# Patient Record
Sex: Female | Born: 1980
Health system: Southern US, Community
[De-identification: ages and names within clinical notes are randomized; demographics above are authoritative.]

## PROBLEM LIST (undated history)

## (undated) ENCOUNTER — Inpatient Hospital Stay (HOSPITAL_COMMUNITY): Payer: Self-pay

## (undated) DIAGNOSIS — R519 Headache, unspecified: Secondary | ICD-10-CM

## (undated) DIAGNOSIS — O139 Gestational [pregnancy-induced] hypertension without significant proteinuria, unspecified trimester: Secondary | ICD-10-CM

## (undated) DIAGNOSIS — I10 Essential (primary) hypertension: Secondary | ICD-10-CM

## (undated) DIAGNOSIS — R51 Headache: Secondary | ICD-10-CM

## (undated) DIAGNOSIS — D649 Anemia, unspecified: Secondary | ICD-10-CM

---

## 2005-10-31 ENCOUNTER — Ambulatory Visit: Payer: Self-pay | Admitting: Family Medicine

## 2005-11-03 ENCOUNTER — Ambulatory Visit: Payer: Self-pay | Admitting: Family Medicine

## 2005-11-05 ENCOUNTER — Ambulatory Visit (HOSPITAL_COMMUNITY): Admission: RE | Admit: 2005-11-05 | Discharge: 2005-11-05 | Payer: Self-pay | Admitting: Family Medicine

## 2005-11-10 ENCOUNTER — Encounter (INDEPENDENT_AMBULATORY_CARE_PROVIDER_SITE_OTHER): Payer: Self-pay | Admitting: Specialist

## 2005-11-10 ENCOUNTER — Ambulatory Visit: Payer: Self-pay | Admitting: Family Medicine

## 2006-10-07 ENCOUNTER — Ambulatory Visit: Payer: Self-pay | Admitting: Cardiology

## 2006-10-21 ENCOUNTER — Encounter: Payer: Self-pay | Admitting: Obstetrics and Gynecology

## 2006-10-21 ENCOUNTER — Inpatient Hospital Stay (HOSPITAL_COMMUNITY): Admission: AD | Admit: 2006-10-21 | Discharge: 2006-10-22 | Payer: Self-pay | Admitting: Family Medicine

## 2006-10-29 ENCOUNTER — Encounter: Payer: Self-pay | Admitting: Cardiology

## 2006-10-29 ENCOUNTER — Ambulatory Visit: Payer: Self-pay

## 2007-01-23 ENCOUNTER — Inpatient Hospital Stay (HOSPITAL_COMMUNITY): Admission: AD | Admit: 2007-01-23 | Discharge: 2007-01-23 | Payer: Self-pay | Admitting: Obstetrics and Gynecology

## 2007-01-24 ENCOUNTER — Inpatient Hospital Stay (HOSPITAL_COMMUNITY): Admission: AD | Admit: 2007-01-24 | Discharge: 2007-01-24 | Payer: Self-pay | Admitting: Obstetrics and Gynecology

## 2007-01-26 ENCOUNTER — Inpatient Hospital Stay (HOSPITAL_COMMUNITY): Admission: AD | Admit: 2007-01-26 | Discharge: 2007-01-30 | Payer: Self-pay | Admitting: Obstetrics and Gynecology

## 2007-01-27 ENCOUNTER — Encounter (INDEPENDENT_AMBULATORY_CARE_PROVIDER_SITE_OTHER): Payer: Self-pay | Admitting: Obstetrics and Gynecology

## 2010-04-10 ENCOUNTER — Inpatient Hospital Stay (HOSPITAL_COMMUNITY): Admission: AD | Admit: 2010-04-10 | Discharge: 2010-04-13 | Payer: Self-pay | Admitting: Obstetrics and Gynecology

## 2010-07-30 LAB — CBC
HCT: 33.9 % — ABNORMAL LOW (ref 36.0–46.0)
Hemoglobin: 10.9 g/dL — ABNORMAL LOW (ref 12.0–15.0)
Hemoglobin: 9.4 g/dL — ABNORMAL LOW (ref 12.0–15.0)
MCH: 25.9 pg — ABNORMAL LOW (ref 26.0–34.0)
MCH: 26 pg (ref 26.0–34.0)
MCHC: 32.2 g/dL (ref 30.0–36.0)
MCHC: 32.5 g/dL (ref 30.0–36.0)
MCV: 80.1 fL (ref 78.0–100.0)
MCV: 80.2 fL (ref 78.0–100.0)
MCV: 80.8 fL (ref 78.0–100.0)
RBC: 3.62 MIL/uL — ABNORMAL LOW (ref 3.87–5.11)
RBC: 3.9 MIL/uL (ref 3.87–5.11)
RBC: 4.23 MIL/uL (ref 3.87–5.11)
RDW: 17.9 % — ABNORMAL HIGH (ref 11.5–15.5)
WBC: 16.1 10*3/uL — ABNORMAL HIGH (ref 4.0–10.5)
WBC: 7.5 10*3/uL (ref 4.0–10.5)
WBC: 9.5 10*3/uL (ref 4.0–10.5)

## 2010-07-30 LAB — URINALYSIS, ROUTINE W REFLEX MICROSCOPIC
Bilirubin Urine: NEGATIVE
Nitrite: NEGATIVE
Protein, ur: NEGATIVE mg/dL
Urobilinogen, UA: 0.2 mg/dL (ref 0.0–1.0)

## 2010-07-30 LAB — COMPREHENSIVE METABOLIC PANEL
AST: 34 U/L (ref 0–37)
Albumin: 2.5 g/dL — ABNORMAL LOW (ref 3.5–5.2)
Alkaline Phosphatase: 185 U/L — ABNORMAL HIGH (ref 39–117)
Alkaline Phosphatase: 235 U/L — ABNORMAL HIGH (ref 39–117)
CO2: 20 mEq/L (ref 19–32)
Calcium: 9 mg/dL (ref 8.4–10.5)
Chloride: 107 mEq/L (ref 96–112)
Creatinine, Ser: 0.59 mg/dL (ref 0.4–1.2)
GFR calc non Af Amer: >60 mL/min (ref >60)
Glucose, Bld: 69 mg/dL — ABNORMAL LOW (ref 70–99)
Glucose, Bld: 76 mg/dL (ref 70–99)
Sodium: 135 mEq/L (ref 135–145)
Total Bilirubin: 0.1 mg/dL — ABNORMAL LOW (ref 0.3–1.2)
Total Bilirubin: 0.9 mg/dL (ref 0.3–1.2)

## 2010-07-30 LAB — LACTATE DEHYDROGENASE: LDH: 200 U/L (ref 94–250)

## 2010-07-30 LAB — RPR: RPR Ser Ql: NONREACTIVE

## 2010-10-01 NOTE — Assessment & Plan Note (Signed)
[facility]                            CARDIOLOGY OFFICE NOTE   NAME:[name]                        MRN:          [id number]  DATE:10/07/2006                            DOB:          1980-10-16    The patient is a 30 year old female whom I am asked to consult on  concerning dyspnea.   The patient has no prior cardiac history.  She typically does not have  dyspnea on exertion, orthopnea, PND, pedal edema, palpitations,  presyncope, syncope, or exertional chest pain.  She is now [redacted] weeks  pregnant.  Over the past week, she has noticed dyspnea at times.  This  is predominantly with exertion, but it can occur with rest.  It does not  change with lying flat.  She apparently is not having PND, and there is  no pedal edema.  She is not having chest pain.  She feel that her heart  rate increases at times, but there are not sustained palpitations, and  there is no presyncope or syncope.  She occasionally feels dizzy, that  is not related to her dyspnea or palpitations.  Because of the above,  were asked to further evaluate.   Her medications include a prenatal vitamin.   SHE HAS NO KNOWN DRUG ALLERGIES.   SOCIAL HISTORY:  She does not smoke or consume alcohol, and she denies  any drug use.   FAMILY HISTORY:  Negative for coronary artery disease.   PAST MEDICAL HISTORY:  There is diabetes mellitus, hypertension, or  hyperlipidemia.  She has had no previous surgeries.  This is her 1st  pregnancy.   REVIEW OF SYSTEMS:  She denies any headaches, fevers, or chills.  There  is no productive cough or hemoptysis.  There is no dysphagia,  odynophagia, melena, or hematochezia.  There is no dysuria or hematuria.  There is no rash or seizure activity.  There is no orthopnea or PND, but  she is complaining of dyspnea on exertion, as described in the HPI.  There is no pedal edema.  The remaining systems are negative.   PHYSICAL EXAMINATION:  Shows a blood  pressure of 98/62, and her pulse is  118.  She weighs 145 pounds.  She is well developed and pregnant.  She is in no acute distress.  SKIN:  Warm and dry.  She does not appear to be depressed.  There is no peripheral clubbing.  HEENT:  Normal with normal eyelids.  NECK:  Supple with a normal upstroke bilaterally.  There no bruits  noted.  I could not appreciate jugular venous distention.  There is no  thyromegaly.  BACK:  Normal.  CHEST:  Clear to auscultation with normal expansion.  CARDIOVASCULAR EXAM:  Reveals a tachycardic rate, but a regular rhythm  is noted.  There is no S3 or S4.  There is a 1/6 systolic ejection  murmur at the left sternal border.  Her PMI is not displaced.  ABDOMINAL EXAM:  Significant for an intrauterine pregnancy.  There is no  tenderness to palpation.  I could not appreciate hepatosplenomegaly or  bruit.  She has 2+ femoral pulses bilaterally, and no bruits.  EXTREMITIES:  Showed no edema.  I could palpate no cords.  She has a  negative Homan's bilaterally.  She has 2+ dorsalis pedis pulses  bilaterally.  NEUROLOGIC EXAM:  Grossly intact.  Her electrocardiogram shows a sinus tachycardia at a rate of 136.  The  axis is normal.  There are no significant ST changes.   DIAGNOSES:  1. Dyspnea:  The etiology of this is not clear to me.  It may be      merely plasma volume expansion related to her pregnancy.  We will      schedule her to have an echocardiogram to quantify her left      ventricular function to exclude early onset of cardiomyopathy that      is pregnancy induced.  If this shows normal left ventricular      function, then I would not pursue further workup.  I will check a      BNP.  Of note, I do not think her dyspnea is related to a pulmonary      embolus.  There is no evidence of deep venous thrombosis on      examination, and I think the likelihood is small.  If her      echocardiogram shows normal left ventricular function, and her BNP       is not significantly elevated, then we will see her back on an as-      needed basis.  2. Tachycardia:  I think this is most likely related to her pregnancy      and volume expansion.  This can be followed in the future.  There      is no significant symptoms with this.     Wanda Frieze Jens Som, MD, [facility]  Electronically Signed    BSC/MedQ  DD: 10/07/2006  DT: 10/07/2006  Job #: [id number]

## 2010-10-01 NOTE — Op Note (Signed)
[name], [name]               ACCOUNT NO.:  [id number]   MEDICAL RECORD NO.:  [id number]          PATIENT TYPE:  INP   LOCATION:  9125                          [facility]   PHYSICIAN:  Janine Limbo, M.D.DATE OF BIRTH:  [birth year]   DATE OF PROCEDURE:  01/27/2007  DATE OF DISCHARGE:                               OPERATIVE REPORT   PREOPERATIVE DIAGNOSES:  1. Term intrauterine gestation.  2. Failure to progress in labor.  3. Chorioamnionitis.   POSTOPERATIVE DIAGNOSES:  1. Term intrauterine gestation.  2. Failure to progress in labor.  3. Chorioamnionitis.   PROCEDURE:  Primary low transverse cesarean section.   SURGEON:  Janine Limbo, M.D.   FIRST ASSISTANT:  Cam Hai, C.N.M.   ANESTHETIC:  Epidural.   DISPOSITION:  [name] is a 30 year old female, gravida 1, para 0, who  presents at 37 weeks' gestation in early labor.  She had a positive beta  strep culture during the third trimester and therefore penicillin was  started.  The patient was 5 cm dilated, 100% effaced, and -2 station  upon presentation on January 26, 2007.  The patient's membranes were  ruptured and she was started on Pitocin.  The patient received an  epidural for pain management.  The patient did not progress during the  course of her labor.  An intrauterine pressure catheter was placed and  Pitocin was started.  The patient had adequate Monte[place] units for  several hours.  A cesarean section was recommended but declined by the  patient.  She developed a temperature to 100.5.  Again a cesarean  section was recommended but declined.  The the patient continued to have  adequate Montevideo units and at approximately 3 a.m. the patient  decided to proceed with cesarean section.  The risk and benefits of  cesarean section had previously been reviewed, but they included, and  not limited to, anesthetic complications, bleeding, infection, and  possible damage to surrounding  organs.   FINDINGS:  A 6 pound 14 ounce female infant Derinda Late) was delivered from  an occiput posterior presentation.  The Apgars were 8 at one minute and  9 at five minutes.  The arterial cord blood pH was 7.49.  The uterus,  fallopian tubes, and the ovaries were normal for the gravid state.   PROCEDURE:  The patient taken to the operating room, where it was  determined that the epidural she had received for labor would be  adequate for cesarean delivery.  A Foley catheter had previously been  placed.  The patient's abdomen was prepped with multiple layers of  Betadine and sterilely draped.  The lower abdomen was injected with 10  mL of 0.5% Marcaine with epinephrine.  A low transverse incision was  made in the abdomen and carried sharply through the subcutaneous tissue,  the fascia, and the anterior peritoneum.  The bladder flap was  developed.  An incision was made in the lower uterine segment and  extended in a low transverse fashion.  The fetal head was delivered  without difficulty.  The mouth and nose were suctioned.  A nuchal cord  was reduced.  The remainder of the infant was then delivered.  The cord  was clamped and cut and the infant was handed to the waiting pediatric  team.  Routine cord blood studies were obtained.  The placenta was  removed.  The uterine cavity was cleaned of amniotic fluid, clotted  blood, and membranes.  The uterine incision was closed using a running  locking suture of 2-0 Vicryl, followed by an imbricating suture of 2-0  Vicryl.  The pelvis was vigorously irrigated.  The patient received a  gram of Ancef intraoperatively.  The anterior peritoneum and the  abdominal musculature were reapproximated in the midline using 2-0  Vicryl.  The fascia and the subcutaneous layer were irrigated.  The  fascia was closed using a running suture of 0 Vicryl followed by three  interrupted sutures of 0 Vicryl.  The subcutaneous layer was closed  using a running suture  of 2-0 Vicryl and figure-of-eight sutures of 3-0  Vicryl.  The skin was reapproximated using a subcuticular suture of 3-0  Monocryl.  The sponge, needle and instrument counts were correct on two  occasions.  Estimated blood loss for the procedure was 800 mL.  The  patient tolerated her procedure well.  She was taken to the recovery  room in stable condition.  The infant was taken to the full-term nursery  in stable condition.  The placenta was sent to pathology.      Janine Limbo, M.D.  Electronically Signed     AVS/MEDQ  D:  01/27/2007  T:  01/27/2007  Job:  [id number]

## 2010-10-01 NOTE — Discharge Summary (Signed)
[name], [name]               ACCOUNT NO.:  [id number]   MEDICAL RECORD NO.:  [id number]          PATIENT TYPE:  INP   LOCATION:  9125                          [facility]   PHYSICIAN:  Naima A. Dillard, M.D. DATE OF BIRTH:  1981/01/08   DATE OF ADMISSION:  01/26/2007  DATE OF DISCHARGE:  01/30/2007                               DISCHARGE SUMMARY   ADMISSION DIAGNOSES:  1. Intrauterine pregnancy at 40 weeks.  2. Active labor.  3. Positive group B strep.   DISCHARGE DIAGNOSES:  1. Intrauterine pregnancy at 40 weeks.  2. Failure to progress in labor.  3. Chorioamnionitis.   PROCEDURE:  Primary low transverse cesarean section.   HOSPITAL COURSE:  Ms. [name] is a 30 year old primigravida who was  admitted at 12 and 4/7 weeks with uterine contractions and active labor.  Her pregnancy has been followed by the [facility] certified  nurse midwife service and has been remarkable for 1) questionable last  menstrual, 2) positive group B strep.  Upon admission her cervix was 5  cm, 100% effaced, vertex -2 with intact membranes.  She was group B  strep positive and, therefore, was started on IV penicillin.  Her vital  signs were stable.  Fetal heart rate was reactive and reassuring.  The  patient progressed to 7 cm by the afternoon.  At that time, her  membranes ruptured during exam for light meconium-stained fluid.  At the  next exam her cervix was actually 6 cm, 90% vertex at 0 station and that  was unchanged by 2 hours later.  Pitocin was started.  The patient did  receive some IV Stadol.  By that evening she was requesting an epidural.  Her fetal heart rate was reassuring.  __________  use showed adequate  labor with Pitocin at 6 milliunits per minute. Cervix was remaining at 5-  6 cm.  By 10:30 p.m. that evening her cervix was still unchanged  Mon[place]video units were greater than 200, temperature was 100.5, and her  ACT was negative.  At that point, Dr. Stefano Gaul was  notified and was  requested to speak with the patient to recommend a cesarean section.  Risks and benefits were discussed, risks of infection were outlined.  At  that point, the patient declined cesarean section and wanted to continue  to attend to vaginal delivery.  By 2:20 a.m. the patient was requesting  another cervical exam, which was still unchanged at 5 cm.  Fetal heart  rate was in the 150s but variability was decreased.  There were  occasional mild variables, contractions remained adequate, temperature  was 100.  Cesarean section continued to be recommended and at that point  the patient consented.  She was prepared to go to the operating room at  that time.  Cesarean section was performed by Dr. Stefano Gaul with Philipp Deputy  certified nurse midwife as first assistant.  Infant was a viable female  by the name of [name], she weighed 6 pounds and 14 ounces, Apgars were 8  and 9.  Cord pH was 7.49.  The infant was taken to  the full-term nursery  in good condition.  The patient tolerated the remainder of the cesarean  section well, and she was taken to the recovery room.  By post-op day  #1, the patient's vital signs were stable.  Her hemoglobin was 8.5 and  had been 11.1 preoperatively.  She was breast and bottle feeding.  By  post-op day #2, she continued to do well.  By day three she was having  some mild abdominal distention and some discomfort.  She had not had a  bowel movement since delivery.  The plan made was to administer Dulcolax  suppository and Fleet's enema as needed in attempts to assist the  patient in having a bowel movement prior to discharge home. Discharge  instructions per [facility] handout.   DISCHARGE MEDICATIONS:  1. Motrin 600 mg 1 p.o. q.6 h p.r.n. pain.  2. Tylox 1-2 p.o. q.3-4 h p.r.n. pain.  3. Prenatal vitamin 1 p.o. daily.   Discharge follow-up will occur at [facility] in 6 weeks or as  needed.      [name], C.N.M.       Naima A. Normand Sloop, M.D.  Electronically Signed    KS/MEDQ  D:  01/30/2007  T:  01/31/2007  Job:  [id number]

## 2010-10-01 NOTE — H&P (Signed)
[name], [name]               ACCOUNT NO.:  [id number]   MEDICAL RECORD NO.:  [id number]          PATIENT TYPE:  INP   LOCATION:  9162                          [facility]   PHYSICIAN:  Janine Limbo, M.D.DATE OF BIRTH:  [birth year]   DATE OF ADMISSION:  01/26/2007  DATE OF DISCHARGE:                              HISTORY & PHYSICAL   [name] is a 30 year old gravida 1, para 0, at 40-4/7 weeks.  Presented with uterine contractions for several days, now increasing.  She was seen in maternity admission unit on September 6 and September 7.  Last exam her cervix was 1 cm, 60%.  She reports positive fetal  movement.  She denies any leaking or bleeding.  Pregnancy remarkable  for:  1. Questionable LMP.  2. Positive group B strep.   PRENATAL LABS:  Blood type is 0 positive.  RH antibody negative.  VDRL  nonreactive.  Viral titer positive.  Hepatitis B surface antigen  negative.  Group B strep culture was positive from a urine culture.  Quadruple screen was done, and the OB labs.  GC and Chlamydia cultures  were negative.  Pap smear was normal.  Cystic fibrosis testing was  declined.  Hemoglobin upon entering the practice was 11.2. It was within  normal limits.  The patient has positive urine culture at 26 weeks.   OBSTETRICAL HISTORY:  Present pregnancy.  The patient entered care at  approximately 15 weeks.  Cervix was normal.  Anatomy was normal.  Quadruple screen was done.  She had another ultrasound at 18 weeks for  anatomy with normal findings.  She had onset of questionable  pyelonephritis symptoms at 26 weeks.  She was admitted to the hospital.  She was admitted between June 4 and June 5 with resolution of pain.  She  was treated with Macrobid.  Glucola was normal.  She had positive group  B strep.  Positive urine and vaginal beta strep cultures.  She had an  ultrasound at 37 weeks with normal findings.  The rest of her pregnancy  has been uncomplicated.  She has been  seen three times over the last  several days in [facility].   The patient is a prima gravida.   MEDICAL HISTORY:  She has occasional yeast infection.  Usual childhood  illnesses.   FAMILY HISTORY:  Noncontributory with no history of hypertension,  diabetes or thyroid disease.   GENETIC HISTORY:  Remarkable for maternal grandmother being a twin and  first cousin having twins.   SOCIAL HISTORY:  The patient is engaged.  The father of the baby is  involved and supportive.  His name is [name].  The patient is  African.  She is from [place].  She is of the [place] faith.  She has  a high school education.  She is Actor at [employer]. Her partner is high school educated.  He is a Clinical cytogeneticist.  She has been followed by the certified nurse midwife service  and a [place].  She denies any alcohol, drug or tobacco use  during this pregnancy.   PHYSICAL EXAMINATION:  VITAL SIGNS:  Stable.  The patient is afebrile.  HEENT: Within normal limits.  LUNGS:  Breath sounds are clear.  HEART:  Regular rate and rhythm without murmur.  BREASTS:  Soft and nontender.  ABDOMEN:  Fundal height is approximately 39 cm.  Estimated fetal weight  is 7 to 7-1/2 pounds.  Uterine contractions are every 2 to 3 minutes.  Moderate quality.  CERVICAL:  5 cm, 100%, vertex at minus 2 station with a bulging bag of  water.  Fetal heart rate is reactive. No decelerations.  EXTREMITIES:  Deep tendon reflexes are 2+ without clonus.  There is a  trace edema noted.   IMPRESSION:  1. Intrauterine pregnancy at 40 weeks.  2. Active labor.  3. Positive group B strep.   PLAN:  1. Admit to birthing suite, consult with Dr. Stefano Gaul who is the      attending physician.  2. Routine certified nurse midwife orders.  3. Plan group B strep prophylaxis, Penicillin G per standard dosing.  4. The patient prefers IV pain medication.      [name], C.N.M.      Janine Limbo, M.D.  Electronically Signed    VLL/MEDQ  D:  01/26/2007  T:  01/26/2007  Job:  [id number]

## 2010-10-01 NOTE — H&P (Signed)
[name], [name]               ACCOUNT NO.:  [id number]   MEDICAL RECORD NO.:  [id number]          PATIENT TYPE:  INP   LOCATION:  9162                          [facility]   PHYSICIAN:  Vicki L. Emilee Hero, C.N.M.DATE OF BIRTH:  1980-06-27   DATE OF ADMISSION:  01/26/2007  DATE OF DISCHARGE:                              HISTORY & PHYSICAL   No dictation for this job.      [name] Emilee Hero, C.N.M.     VLL/MEDQ  D:  01/26/2007  T:  01/26/2007  Job:  [id number]

## 2010-10-01 NOTE — H&P (Signed)
[name], [name]               ACCOUNT NO.:  [id number]   MEDICAL RECORD NO.:  [id number]          PATIENT TYPE:  MAT   LOCATION:  MATC                          [facility]   PHYSICIAN:  Janine Limbo, M.D.DATE OF BIRTH:  1981/03/19   DATE OF ADMISSION:  10/21/2006  DATE OF DISCHARGE:                              HISTORY & PHYSICAL   The patient is a 30 year old gravida 1, para 0 at 26 weeks and 1 day,  EDD January 26, 2007 as determined by 15-week ultrasound and confirmed  with follow-up.  She presents from the office of [facility] with complaint of  fever and chills since last p.m. with right lower quadrant and lower  abdominal pain. Pain is constant and then becomes sharp and more severe  at times. She reports positive fetal movement.  No vaginal bleeding or  rupture of membranes.  She has no cold or upper respiratory infection  symptoms.  No nausea or vomiting.  She did eat breakfast this a.m.  however, she has no appetite at this time. Her pregnancy has been  followed by the C.N.M. service at [facility] and is remarkable for  questionable LMP.  This patient began prenatal care on July 22, 2005 at  approximately 12 weeks' gestation, [facility] determined by pregnancy  ultrasound and confirmed with follow-up.  Her pregnancy has been  essentially unremarkable to this point.   PRENATAL LAB WORK:  Hemoglobin and hematocrit 11.2 and 33.4, platelets  233,000.  Blood type and Rh O+, antibody screen negative, VDRL  nonreactive, rubella immune, hepatitis B surface antigen negative.  Pap  smear within normal limits.  GC and chlamydia negative.  CF testing  declined.  Quad screen within normal limits.   MEDICAL HISTORY:  Is unremarkable.   FAMILY HISTORY:  Is unremarkable.   GENETIC HISTORY:  There is no genetic history of familial or chromosomal  disorders, any babies that were born with birth defects or any that died  in infancy.  The patient has no known drug allergies.  She denies the  use of tobacco, alcohol or illicit drugs.  Her height is 5 feet.  Her  pre gravid weight is 128.   SOCIAL HISTORY:  The patient is a 30 year old gravida 1, para 0 at 33  weeks who presents with fever and chills and right lower quadrant  abdominal pain.   PHYSICAL EXAM:  VITAL SIGNS:  Temperature 101.9, pulse 121, respirations  18, blood pressure 109/57.  Fetal heart rate is 168 with positive  variability, positive accelerations. She is contracting every 3-5  minutes.  The patient's heart rate is tachycardic.  LUNGS:  Clear and equal bilaterally to auscultation.  ABDOMEN:  Soft and tender, soft to palpation.  It is tender over entire  lower abdomen especially on the right with no guarding or rebound.  She  has negative CVA tenderness bilaterally.  EXTREMITIES:  Show no edema.  Negative Homan's bilaterally.   Cath UA shows specific gravity less than 1.005, pH of 6, ketones of 40  mg per dL, otherwise negative.  CBC shows a WBC of 18.5, hemoglobin  10.2, hematocrit 31.5 and platelets 187,000.  Neutrophils are up at 87.  Absolute granulocytes up at 16.1 and absolute monocytes up at 1.1.   ASSESSMENT:  Intrauterine pregnancy at 26-1/7 weeks, right lower  quadrant pain, maternal febrile morbidity, rule out appendicitis.   PLAN:  Admit, MRI to evaluate appendicitis.  This will be done at [facility] or [facility].  Begin Zosyn 3.375 mg IV q.6 hours.      Rica Koyanagi, C.N.M.      Janine Limbo, M.D.  Electronically Signed    SDM/MEDQ  D:  10/21/2006  T:  10/21/2006  Job:  [id number]

## 2010-10-04 NOTE — Discharge Summary (Signed)
[name], [name]               ACCOUNT NO.:  [id number]   MEDICAL RECORD NO.:  [id number]          PATIENT TYPE:  INP   LOCATION:  9174                          [facility]   PHYSICIAN:  Janine Limbo, M.D.DATE OF BIRTH:  04/16/1981   DATE OF ADMISSION:  10/21/2006  DATE OF DISCHARGE:  10/22/2006                               DISCHARGE SUMMARY   ADMISSION DIAGNOSES:  1. Intrauterine pregnancy at 26-1/7 weeks.  2. Right lower quadrant pain.  3. Maternal febrile morbidity.  4. Rule out appendicitis.   DISCHARGE DIAGNOSES:  1. Intrauterine pregnancy at 26-2/7 weeks.  2. Questionable pyelonephritis.  3. No evidence of appendicitis or other morbidity.   HOSPITAL PROCEDURES:  1. IV antibiotics.  2. MRI.   HOSPITAL COURSE:  The patient was admitted at 26 weeks with fever and  chills with right lower quadrant and lower abdominal pain.  She had a  temperature of 101.9.  fetal heart rate was 160's.  Her lungs were  clear.  Heart was tachycardic.  Abdomen was soft and tender to palpation  over the entire abdomen. No guarding or rebound was noted.  Urinalysis  was negative except for 40 of ketones.  CBC showed white blood cell  count of 18.5, hemoglobin of 10.2 with increased neutrophils at 87.  She  was admitted for evaluation and treatment.  She was begun on Zosyn for  antibiotic coverage and was taken to [facility] for MRI.  The  MRI showed no signs of appendicitis.  Later that evening her temperature  was 99.6.  Her exam was stable. She was discontinued on the Zosyn and  started on Ancef.  The next morning she was feeling fine.  No pain,  nausea or vomiting.  Temperature was 97.8. Abdomen was nontender.  Fetal  fibronectin was negative.  Gonorrhea and Chlamydia were negative.  White  blood cell count was down to 11.2.  The ultrasound showed vertex  presentation, normal fluid, normal growth, normal anatomy, and a long  cervix.  She was having sporadic  contractions that she did not feel.  Renal ultrasound showed normal appearance with bilateral ureteral jets.  No evidence of stones.  There were no further contractions.  She was  deemed to have received full benefit of her hospital stay and was  discharged home on Keflex for 7 days and followed up in the office.   DISCHARGE MEDICATIONS:  Keflex 500 mg p.o. b.i.d. x7 days.   DISCHARGE LABORATORY DATA:  White blood cell count 11.2, hemoglobin 9.7,  platelet count 163,000.  Fetal fibronectin negative.  Gonorrhea and  Chlamydia both negative.  Urine culture and group B Strep pending.   DISCHARGE INSTRUCTIONS:  Include observing for signs and symptoms of  worsening.   FOLLOWUP:  Discharge followup in one week.   CONDITION ON DISCHARGE:  Good.      Marie L. Williams, C.N.M.      Janine Limbo, M.D.  Electronically Signed    MLW/MEDQ  D:  11/29/2006  T:  11/30/2006  Job:  [id number]

## 2011-02-28 LAB — CBC
MCV: 73.4 — ABNORMAL LOW
Platelets: 139 — ABNORMAL LOW
RBC: 4.63
WBC: 10.4
WBC: 11.4 — ABNORMAL HIGH

## 2011-02-28 LAB — RPR: RPR Ser Ql: NONREACTIVE

## 2011-02-28 LAB — COMPREHENSIVE METABOLIC PANEL
Albumin: 2.9 — ABNORMAL LOW
Alkaline Phosphatase: 348 — ABNORMAL HIGH
CO2: 22
Calcium: 8.9
Chloride: 104
Creatinine, Ser: 0.71
GFR calc Af Amer: >60
Glucose, Bld: 74
Sodium: 135

## 2011-02-28 LAB — URIC ACID: Uric Acid, Serum: 6

## 2011-02-28 LAB — RAPID HIV SCREEN (WH-MAU): Rapid HIV Screen: NONREACTIVE

## 2011-03-06 LAB — DIFFERENTIAL
Basophils Relative: 0
Eosinophils Absolute: 0
Eosinophils Relative: 0
Eosinophils Relative: 0
Lymphocytes Relative: 11 — ABNORMAL LOW
Lymphocytes Relative: 7 — ABNORMAL LOW
Lymphs Abs: 1.2
Lymphs Abs: 1.3
Monocytes Absolute: 1.1 — ABNORMAL HIGH
Monocytes Relative: 6
Monocytes Relative: 6

## 2011-03-06 LAB — CBC
HCT: 29.8 — ABNORMAL LOW
Hemoglobin: 10.2 — ABNORMAL LOW
MCV: 77.9 — ABNORMAL LOW
MCV: 77.9 — ABNORMAL LOW
RBC: 3.83 — ABNORMAL LOW
RBC: 4.05
RDW: 15.4 — ABNORMAL HIGH
WBC: 11.2 — ABNORMAL HIGH

## 2011-03-06 LAB — COMPREHENSIVE METABOLIC PANEL
ALT: 10
Alkaline Phosphatase: 63
BUN: 3 — ABNORMAL LOW
CO2: 20
Chloride: 103
Creatinine, Ser: 0.59
Glucose, Bld: 88
Sodium: 133 — ABNORMAL LOW

## 2011-03-06 LAB — URINALYSIS, ROUTINE W REFLEX MICROSCOPIC
Bilirubin Urine: NEGATIVE
Ketones, ur: 40 — AB
Nitrite: NEGATIVE
Protein, ur: NEGATIVE
Urobilinogen, UA: 0.2

## 2012-04-28 ENCOUNTER — Ambulatory Visit (INDEPENDENT_AMBULATORY_CARE_PROVIDER_SITE_OTHER): Payer: 59 | Admitting: Obstetrics and Gynecology

## 2012-04-28 ENCOUNTER — Encounter: Payer: Self-pay | Admitting: Obstetrics and Gynecology

## 2012-04-28 VITALS — BP 120/70 | HR 68 | Resp 16 | Ht 60.0 in | Wt 136.0 lb

## 2012-04-28 DIAGNOSIS — Z124 Encounter for screening for malignant neoplasm of cervix: Secondary | ICD-10-CM

## 2012-04-28 DIAGNOSIS — Z01419 Encounter for gynecological examination (general) (routine) without abnormal findings: Secondary | ICD-10-CM

## 2012-04-28 NOTE — Progress Notes (Addendum)
Contraception None Last pap 05/23/2010 Last Mammo None Last Colonoscopy None Last Dexa Scan None Primary MD None Abuse at Home None  No complaints  Filed Vitals:   04/28/12 1102  BP: 120/70  Pulse: 68  Resp: 16   ROS: noncontributory  Physical Examination: General appearance - alert, well appearing, and in no distress Neck - supple, no significant adenopathy Chest - clear to auscultation, no wheezes, rales or rhonchi, symmetric air entry Heart - normal rate and regular rhythm Abdomen - soft, nontender, nondistended, no masses or organomegaly Breasts - breasts appear normal, no suspicious masses, no skin or nipple changes or axillary nodes Pelvic - normal external genitalia, vulva, vagina, cervix, uterus and adnexa Back exam - no CVAT Extremities - no edema, redness or tenderness in the calves or thighs  A/P Pap today RTO 45yr for AEX Refer to Dr. Allyne Gee - pt needs a PCP

## 2012-04-29 LAB — PAP IG W/ RFLX HPV ASCU

## 2013-01-22 LAB — OB RESULTS CONSOLE GBS: STREP GROUP B AG: POSITIVE

## 2013-01-22 LAB — OB RESULTS CONSOLE HIV ANTIBODY (ROUTINE TESTING): HIV: NONREACTIVE

## 2013-02-01 LAB — OB RESULTS CONSOLE GC/CHLAMYDIA
CHLAMYDIA, DNA PROBE: NEGATIVE
Gonorrhea: NEGATIVE

## 2013-02-01 LAB — OB RESULTS CONSOLE HEPATITIS B SURFACE ANTIGEN: Hepatitis B Surface Ag: NEGATIVE

## 2013-02-21 LAB — OB RESULTS CONSOLE ABO/RH: RH Type: POSITIVE

## 2013-02-21 LAB — OB RESULTS CONSOLE RUBELLA ANTIBODY, IGM: Rubella: IMMUNE

## 2013-03-02 ENCOUNTER — Encounter (HOSPITAL_COMMUNITY): Payer: Self-pay | Admitting: Family Medicine

## 2013-03-02 ENCOUNTER — Other Ambulatory Visit: Payer: Self-pay | Admitting: Family Medicine

## 2013-03-02 DIAGNOSIS — Z3689 Encounter for other specified antenatal screening: Secondary | ICD-10-CM

## 2013-03-02 DIAGNOSIS — O283 Abnormal ultrasonic finding on antenatal screening of mother: Secondary | ICD-10-CM

## 2013-03-11 ENCOUNTER — Ambulatory Visit (HOSPITAL_COMMUNITY): Admission: RE | Admit: 2013-03-11 | Payer: 59 | Source: Ambulatory Visit

## 2013-03-11 ENCOUNTER — Encounter (HOSPITAL_COMMUNITY): Payer: Self-pay

## 2013-03-11 ENCOUNTER — Ambulatory Visit (HOSPITAL_COMMUNITY)
Admission: RE | Admit: 2013-03-11 | Discharge: 2013-03-11 | Disposition: A | Discharge status: Home / Self Care | Payer: 59 | Source: Ambulatory Visit | Attending: Family Medicine | Admitting: Family Medicine

## 2013-03-11 ENCOUNTER — Inpatient Hospital Stay (HOSPITAL_COMMUNITY): Admission: AD | Admit: 2013-03-11 | Payer: Self-pay | Source: Ambulatory Visit | Admitting: Obstetrics and Gynecology

## 2013-03-11 DIAGNOSIS — Z1389 Encounter for screening for other disorder: Secondary | ICD-10-CM | POA: Diagnosis not present

## 2013-03-11 DIAGNOSIS — Z363 Encounter for antenatal screening for malformations: Secondary | ICD-10-CM | POA: Insufficient documentation

## 2013-03-11 DIAGNOSIS — O34219 Maternal care for unspecified type scar from previous cesarean delivery: Secondary | ICD-10-CM | POA: Insufficient documentation

## 2013-03-11 DIAGNOSIS — O358XX0 Maternal care for other (suspected) fetal abnormality and damage, not applicable or unspecified: Secondary | ICD-10-CM | POA: Diagnosis present

## 2013-03-11 DIAGNOSIS — O283 Abnormal ultrasonic finding on antenatal screening of mother: Secondary | ICD-10-CM

## 2013-03-11 DIAGNOSIS — Z3689 Encounter for other specified antenatal screening: Secondary | ICD-10-CM

## 2013-03-11 NOTE — Progress Notes (Signed)
[name]  was seen today for an ultrasound appointment.  See full report in [facility].  Comments: [name] was seen today due to a suspicion of a small right cardiac ventricle that was noted on recent ultrasound.  On exam, there appears to be a prominent moderator band that gives the appearance of a smaller right ventricle compared to the left ventricle. The fetal heart anatomy otherwise appears normal.   Impression: Single IUP at 20 0/7 weeks Prominent moderator band in the right ventricle - heart anatomy otherwise appears normal Fetal anatomic survey is otherwise within normal limits. Ultrasound measurements consistent with dates Right lateral placenta without previa Normal amniotic fluid volume  Recommendations: Will schedule fetal echo - although feel that the likelihood of a fetal cardiac abnormality is unlikely Follow-up ultrasounds as clinically indicated.   Alpha Gula, MD

## 2013-07-08 ENCOUNTER — Encounter (HOSPITAL_COMMUNITY): Payer: Self-pay | Admitting: Pharmacist

## 2013-07-08 ENCOUNTER — Other Ambulatory Visit: Payer: Self-pay | Admitting: Obstetrics and Gynecology

## 2013-07-18 ENCOUNTER — Inpatient Hospital Stay (HOSPITAL_COMMUNITY): Admission: RE | Admit: 2013-07-18 | Payer: 59 | Source: Ambulatory Visit

## 2013-07-20 ENCOUNTER — Other Ambulatory Visit: Payer: Self-pay | Admitting: Obstetrics and Gynecology

## 2013-07-22 ENCOUNTER — Encounter (HOSPITAL_COMMUNITY): Payer: Self-pay | Admitting: Registered Nurse

## 2013-07-22 ENCOUNTER — Encounter (HOSPITAL_COMMUNITY)
Admission: RE | Disposition: A | Discharge status: Home / Self Care | Payer: Self-pay | Source: Ambulatory Visit | Attending: Obstetrics and Gynecology

## 2013-07-22 ENCOUNTER — Inpatient Hospital Stay (HOSPITAL_COMMUNITY): Payer: 59 | Admitting: Registered Nurse

## 2013-07-22 ENCOUNTER — Encounter (HOSPITAL_COMMUNITY): Payer: 59 | Admitting: Registered Nurse

## 2013-07-22 ENCOUNTER — Inpatient Hospital Stay (HOSPITAL_COMMUNITY)
Admission: RE | Admit: 2013-07-22 | Discharge: 2013-07-25 | DRG: 766 | Disposition: A | Discharge status: Home / Self Care | Payer: 59 | Source: Ambulatory Visit | Attending: Obstetrics and Gynecology | Admitting: Obstetrics and Gynecology

## 2013-07-22 DIAGNOSIS — O34219 Maternal care for unspecified type scar from previous cesarean delivery: Secondary | ICD-10-CM | POA: Diagnosis present

## 2013-07-22 DIAGNOSIS — O321XX0 Maternal care for breech presentation, not applicable or unspecified: Principal | ICD-10-CM | POA: Diagnosis present

## 2013-07-22 DIAGNOSIS — Z880 Allergy status to penicillin: Secondary | ICD-10-CM

## 2013-07-22 DIAGNOSIS — O9989 Other specified diseases and conditions complicating pregnancy, childbirth and the puerperium: Secondary | ICD-10-CM

## 2013-07-22 DIAGNOSIS — D649 Anemia, unspecified: Secondary | ICD-10-CM | POA: Diagnosis not present

## 2013-07-22 DIAGNOSIS — Z98891 History of uterine scar from previous surgery: Secondary | ICD-10-CM

## 2013-07-22 DIAGNOSIS — O9903 Anemia complicating the puerperium: Secondary | ICD-10-CM | POA: Diagnosis not present

## 2013-07-22 DIAGNOSIS — O99892 Other specified diseases and conditions complicating childbirth: Secondary | ICD-10-CM | POA: Diagnosis present

## 2013-07-22 DIAGNOSIS — G5600 Carpal tunnel syndrome, unspecified upper limb: Secondary | ICD-10-CM | POA: Diagnosis present

## 2013-07-22 DIAGNOSIS — Z22330 Carrier of Group B streptococcus: Secondary | ICD-10-CM

## 2013-07-22 LAB — CBC
HCT: 32 % — ABNORMAL LOW (ref 36.0–46.0)
HEMOGLOBIN: 10.2 g/dL — AB (ref 12.0–15.0)
MCH: 24.6 pg — ABNORMAL LOW (ref 26.0–34.0)
MCHC: 31.9 g/dL (ref 30.0–36.0)
MCV: 77.1 fL — AB (ref 78.0–100.0)
Platelets: 138 10*3/uL — ABNORMAL LOW (ref 150–400)
RBC: 4.15 MIL/uL (ref 3.87–5.11)
RDW: 17.7 % — ABNORMAL HIGH (ref 11.5–15.5)
WBC: 9.5 10*3/uL (ref 4.0–10.5)

## 2013-07-22 LAB — TYPE AND SCREEN
ABO/RH(D): O POS
Antibody Screen: NEGATIVE

## 2013-07-22 LAB — ABO/RH: ABO/RH(D): O POS

## 2013-07-22 LAB — RPR: RPR: NONREACTIVE

## 2013-07-22 SURGERY — Surgical Case
Anesthesia: Spinal | Site: Abdomen

## 2013-07-22 MED ORDER — OXYCODONE-ACETAMINOPHEN 5-325 MG PO TABS
1.0000 | ORAL_TABLET | ORAL | Status: DC | PRN
  Administered 2013-07-23: 2 via ORAL
  Administered 2013-07-23: 1 via ORAL
  Administered 2013-07-23 – 2013-07-24 (×4): 2 via ORAL
  Administered 2013-07-24: 1 via ORAL
  Administered 2013-07-24 – 2013-07-25 (×4): 2 via ORAL
  Filled 2013-07-22 (×7): qty 2
  Filled 2013-07-22: qty 1
  Filled 2013-07-22 (×3): qty 2

## 2013-07-22 MED ORDER — PHENYLEPHRINE 8 MG IN D5W 100 ML (0.08MG/ML) PREMIX OPTIME
INJECTION | INTRAVENOUS | Status: DC | PRN
  Administered 2013-07-22: 60 ug/min via INTRAVENOUS

## 2013-07-22 MED ORDER — FENTANYL CITRATE 0.05 MG/ML IJ SOLN
INTRAMUSCULAR | Status: DC | PRN
  Administered 2013-07-22: 15 ug via INTRATHECAL

## 2013-07-22 MED ORDER — MENTHOL 3 MG MT LOZG
1.0000 | LOZENGE | OROMUCOSAL | Status: DC | PRN
  Filled 2013-07-22: qty 9

## 2013-07-22 MED ORDER — SIMETHICONE 80 MG PO CHEW
80.0000 mg | CHEWABLE_TABLET | Freq: Three times a day (TID) | ORAL | Status: DC
  Administered 2013-07-22 – 2013-07-25 (×9): 80 mg via ORAL
  Filled 2013-07-22 (×14): qty 1

## 2013-07-22 MED ORDER — TETANUS-DIPHTH-ACELL PERTUSSIS 5-2.5-18.5 LF-MCG/0.5 IM SUSP
0.5000 mL | Freq: Once | INTRAMUSCULAR | Status: DC
  Filled 2013-07-22: qty 0.5

## 2013-07-22 MED ORDER — KETOROLAC TROMETHAMINE 30 MG/ML IJ SOLN: 30.0000 mg | Freq: Four times a day (QID) | INTRAMUSCULAR | Status: AC | PRN

## 2013-07-22 MED ORDER — WITCH HAZEL-GLYCERIN EX PADS: 1.0000 "application " | MEDICATED_PAD | CUTANEOUS | Status: DC | PRN

## 2013-07-22 MED ORDER — DIPHENHYDRAMINE HCL 25 MG PO CAPS
25.0000 mg | ORAL_CAPSULE | Freq: Four times a day (QID) | ORAL | Status: DC | PRN
  Filled 2013-07-22: qty 1

## 2013-07-22 MED ORDER — OXYTOCIN 40 UNITS IN LACTATED RINGERS INFUSION - SIMPLE MED: 62.5000 mL/h | INTRAVENOUS | Status: AC

## 2013-07-22 MED ORDER — SIMETHICONE 80 MG PO CHEW
80.0000 mg | CHEWABLE_TABLET | ORAL | Status: DC
  Administered 2013-07-23 – 2013-07-24 (×3): 80 mg via ORAL
  Filled 2013-07-22 (×5): qty 1

## 2013-07-22 MED ORDER — ONDANSETRON HCL 4 MG PO TABS: 4.0000 mg | ORAL_TABLET | ORAL | Status: DC | PRN

## 2013-07-22 MED ORDER — NALBUPHINE HCL 10 MG/ML IJ SOLN
5.0000 mg | INTRAMUSCULAR | Status: DC | PRN
Start: 2013-07-22 — End: 2013-07-25

## 2013-07-22 MED ORDER — MEPERIDINE HCL 25 MG/ML IJ SOLN: 6.2500 mg | INTRAMUSCULAR | Status: DC | PRN

## 2013-07-22 MED ORDER — DIPHENHYDRAMINE HCL 50 MG/ML IJ SOLN: 25.0000 mg | INTRAMUSCULAR | Status: DC | PRN

## 2013-07-22 MED ORDER — PHENYLEPHRINE 8 MG IN D5W 100 ML (0.08MG/ML) PREMIX OPTIME
INJECTION | INTRAVENOUS | Status: AC
  Filled 2013-07-22: qty 100

## 2013-07-22 MED ORDER — OXYTOCIN 10 UNIT/ML IJ SOLN
40.0000 [IU] | INTRAMUSCULAR | Status: DC | PRN
  Administered 2013-07-22: 40 [IU] via INTRAVENOUS

## 2013-07-22 MED ORDER — PHENYLEPHRINE 40 MCG/ML (10ML) SYRINGE FOR IV PUSH (FOR BLOOD PRESSURE SUPPORT)
PREFILLED_SYRINGE | INTRAVENOUS | Status: AC
  Filled 2013-07-22: qty 5

## 2013-07-22 MED ORDER — METOCLOPRAMIDE HCL 5 MG/ML IJ SOLN: 10.0000 mg | Freq: Three times a day (TID) | INTRAMUSCULAR | Status: DC | PRN

## 2013-07-22 MED ORDER — LACTATED RINGERS IV SOLN
INTRAVENOUS | Status: DC
  Administered 2013-07-22: 16:00:00 via INTRAVENOUS

## 2013-07-22 MED ORDER — MORPHINE SULFATE 0.5 MG/ML IJ SOLN
INTRAMUSCULAR | Status: AC
  Filled 2013-07-22: qty 10

## 2013-07-22 MED ORDER — ONDANSETRON HCL 4 MG/2ML IJ SOLN
INTRAMUSCULAR | Status: AC
  Filled 2013-07-22: qty 2

## 2013-07-22 MED ORDER — CEFAZOLIN SODIUM-DEXTROSE 2-3 GM-% IV SOLR
INTRAVENOUS | Status: AC
  Filled 2013-07-22: qty 50

## 2013-07-22 MED ORDER — FENTANYL CITRATE 0.05 MG/ML IJ SOLN
25.0000 ug | INTRAMUSCULAR | Status: DC | PRN
  Administered 2013-07-22 (×2): 50 ug via INTRAVENOUS

## 2013-07-22 MED ORDER — NALOXONE HCL 0.4 MG/ML IJ SOLN: 0.4000 mg | INTRAMUSCULAR | Status: DC | PRN

## 2013-07-22 MED ORDER — DIBUCAINE 1 % RE OINT
1.0000 "application " | TOPICAL_OINTMENT | RECTAL | Status: DC | PRN
  Filled 2013-07-22: qty 28

## 2013-07-22 MED ORDER — LANOLIN HYDROUS EX OINT: 1.0000 "application " | TOPICAL_OINTMENT | CUTANEOUS | Status: DC | PRN

## 2013-07-22 MED ORDER — NALOXONE HCL 1 MG/ML IJ SOLN
1.0000 ug/kg/h | INTRAVENOUS | Status: DC | PRN
  Filled 2013-07-22: qty 2

## 2013-07-22 MED ORDER — KETOROLAC TROMETHAMINE 30 MG/ML IJ SOLN
30.0000 mg | Freq: Four times a day (QID) | INTRAMUSCULAR | Status: AC | PRN
  Administered 2013-07-22: 30 mg via INTRAVENOUS
  Filled 2013-07-22: qty 1

## 2013-07-22 MED ORDER — LACTATED RINGERS IV SOLN
Freq: Once | INTRAVENOUS | Status: AC
  Administered 2013-07-22: 10:00:00 via INTRAVENOUS

## 2013-07-22 MED ORDER — NALBUPHINE HCL 10 MG/ML IJ SOLN: 5.0000 mg | INTRAMUSCULAR | Status: DC | PRN

## 2013-07-22 MED ORDER — IBUPROFEN 600 MG PO TABS
600.0000 mg | ORAL_TABLET | Freq: Four times a day (QID) | ORAL | Status: DC
  Administered 2013-07-23 – 2013-07-25 (×11): 600 mg via ORAL
  Filled 2013-07-22 (×11): qty 1

## 2013-07-22 MED ORDER — DIPHENHYDRAMINE HCL 25 MG PO CAPS
25.0000 mg | ORAL_CAPSULE | ORAL | Status: DC | PRN
  Filled 2013-07-22: qty 1

## 2013-07-22 MED ORDER — SENNOSIDES-DOCUSATE SODIUM 8.6-50 MG PO TABS
2.0000 | ORAL_TABLET | ORAL | Status: DC
  Administered 2013-07-23 – 2013-07-24 (×3): 2 via ORAL
  Filled 2013-07-22 (×5): qty 2

## 2013-07-22 MED ORDER — SCOPOLAMINE 1 MG/3DAYS TD PT72: 1.0000 | MEDICATED_PATCH | Freq: Once | TRANSDERMAL | Status: DC

## 2013-07-22 MED ORDER — LACTATED RINGERS IV BOLUS (SEPSIS)
500.0000 mL | Freq: Once | INTRAVENOUS | Status: AC
  Administered 2013-07-22: 21:00:00 via INTRAVENOUS

## 2013-07-22 MED ORDER — ONDANSETRON HCL 4 MG/2ML IJ SOLN
INTRAMUSCULAR | Status: DC | PRN
  Administered 2013-07-22: 4 mg via INTRAVENOUS

## 2013-07-22 MED ORDER — OXYTOCIN 10 UNIT/ML IJ SOLN
INTRAMUSCULAR | Status: AC
  Filled 2013-07-22: qty 4

## 2013-07-22 MED ORDER — SODIUM CHLORIDE 0.9 % IJ SOLN
3.0000 mL | INTRAMUSCULAR | Status: DC | PRN
Start: 2013-07-22 — End: 2013-07-25

## 2013-07-22 MED ORDER — MORPHINE SULFATE (PF) 0.5 MG/ML IJ SOLN
INTRAMUSCULAR | Status: DC | PRN
  Administered 2013-07-22: .1 mg via INTRATHECAL

## 2013-07-22 MED ORDER — SCOPOLAMINE 1 MG/3DAYS TD PT72
1.0000 | MEDICATED_PATCH | Freq: Once | TRANSDERMAL | Status: DC
  Administered 2013-07-22: 1.5 mg via TRANSDERMAL

## 2013-07-22 MED ORDER — ONDANSETRON HCL 4 MG/2ML IJ SOLN: 4.0000 mg | Freq: Three times a day (TID) | INTRAMUSCULAR | Status: DC | PRN

## 2013-07-22 MED ORDER — ONDANSETRON HCL 4 MG/2ML IJ SOLN
4.0000 mg | INTRAMUSCULAR | Status: DC | PRN
  Administered 2013-07-22: 4 mg via INTRAVENOUS
  Filled 2013-07-22: qty 2

## 2013-07-22 MED ORDER — PHENYLEPHRINE HCL 10 MG/ML IJ SOLN
INTRAMUSCULAR | Status: DC | PRN
  Administered 2013-07-22: 80 ug via INTRAVENOUS

## 2013-07-22 MED ORDER — PRENATAL MULTIVITAMIN CH
1.0000 | ORAL_TABLET | Freq: Every day | ORAL | Status: DC
  Administered 2013-07-23 – 2013-07-25 (×3): 1 via ORAL
  Filled 2013-07-22 (×5): qty 1

## 2013-07-22 MED ORDER — FENTANYL CITRATE 0.05 MG/ML IJ SOLN
INTRAMUSCULAR | Status: AC
  Filled 2013-07-22: qty 2

## 2013-07-22 MED ORDER — CEFAZOLIN SODIUM-DEXTROSE 2-3 GM-% IV SOLR
2.0000 g | INTRAVENOUS | Status: AC
  Administered 2013-07-22: 2 g via INTRAVENOUS

## 2013-07-22 MED ORDER — DIPHENHYDRAMINE HCL 50 MG/ML IJ SOLN: 12.5000 mg | INTRAMUSCULAR | Status: DC | PRN

## 2013-07-22 MED ORDER — GENTAMICIN SULFATE 40 MG/ML IJ SOLN: INTRAVENOUS | Status: DC

## 2013-07-22 MED ORDER — BUPIVACAINE IN DEXTROSE 0.75-8.25 % IT SOLN
INTRATHECAL | Status: DC | PRN
  Administered 2013-07-22: 10 mg via INTRATHECAL

## 2013-07-22 MED ORDER — KETOROLAC TROMETHAMINE 30 MG/ML IJ SOLN
INTRAMUSCULAR | Status: AC
  Administered 2013-07-22: 30 mg via INTRAMUSCULAR
  Filled 2013-07-22: qty 1

## 2013-07-22 MED ORDER — LACTATED RINGERS IV SOLN
INTRAVENOUS | Status: DC
Start: 2013-07-22 — End: 2013-07-22
  Administered 2013-07-22 (×2): via INTRAVENOUS

## 2013-07-22 MED ORDER — ZOLPIDEM TARTRATE 5 MG PO TABS: 5.0000 mg | ORAL_TABLET | Freq: Every evening | ORAL | Status: DC | PRN

## 2013-07-22 MED ORDER — SIMETHICONE 80 MG PO CHEW
80.0000 mg | CHEWABLE_TABLET | ORAL | Status: DC | PRN
  Filled 2013-07-22: qty 1

## 2013-07-22 MED ORDER — SCOPOLAMINE 1 MG/3DAYS TD PT72
MEDICATED_PATCH | TRANSDERMAL | Status: AC
  Administered 2013-07-22: 1.5 mg via TRANSDERMAL
  Filled 2013-07-22: qty 1

## 2013-07-22 SURGICAL SUPPLY — 35 items
BENZOIN TINCTURE PRP APPL 2/3 (GAUZE/BANDAGES/DRESSINGS) ×2 IMPLANT
CLAMP CORD UMBIL (MISCELLANEOUS) ×2 IMPLANT
CLOTH BEACON ORANGE TIMEOUT ST (SAFETY) ×2 IMPLANT
CONTAINER PREFILL 10% NBF 15ML (MISCELLANEOUS) IMPLANT
DRAPE LG THREE QUARTER DISP (DRAPES) ×2 IMPLANT
DRSG OPSITE POSTOP 4X10 (GAUZE/BANDAGES/DRESSINGS) ×2 IMPLANT
DURAPREP 26ML APPLICATOR (WOUND CARE) ×2 IMPLANT
ELECT REM PT RETURN 9FT ADLT (ELECTROSURGICAL) ×2
ELECTRODE REM PT RTRN 9FT ADLT (ELECTROSURGICAL) ×1 IMPLANT
EXTRACTOR VACUUM M CUP 4 TUBE (SUCTIONS) IMPLANT
GLOVE BIO SURGEON STRL SZ7.5 (GLOVE) ×2 IMPLANT
GLOVE BIOGEL PI IND STRL 7.5 (GLOVE) ×1 IMPLANT
GLOVE BIOGEL PI INDICATOR 7.5 (GLOVE) ×1
GOWN STRL REUS W/ TWL XL LVL3 (GOWN DISPOSABLE) ×1 IMPLANT
GOWN STRL REUS W/TWL LRG LVL3 (GOWN DISPOSABLE) ×2 IMPLANT
GOWN STRL REUS W/TWL XL LVL3 (GOWN DISPOSABLE) ×1
KIT ABG SYR 3ML LUER SLIP (SYRINGE) IMPLANT
NEEDLE HYPO 25X5/8 SAFETYGLIDE (NEEDLE) IMPLANT
NS IRRIG 1000ML POUR BTL (IV SOLUTION) ×2 IMPLANT
PACK C SECTION WH (CUSTOM PROCEDURE TRAY) ×2 IMPLANT
PAD OB MATERNITY 4.3X12.25 (PERSONAL CARE ITEMS) ×2 IMPLANT
RETRACTOR WND ALEXIS 25 LRG (MISCELLANEOUS) ×1 IMPLANT
RTRCTR WOUND ALEXIS 25CM LRG (MISCELLANEOUS) ×2
SPONGE LAP 18X18 X RAY DECT (DISPOSABLE) ×2 IMPLANT
STRIP CLOSURE SKIN 1/2X4 (GAUZE/BANDAGES/DRESSINGS) ×2 IMPLANT
SUT CHROMIC 2 0 CT 1 (SUTURE) ×2 IMPLANT
SUT MNCRL AB 3-0 PS2 27 (SUTURE) ×2 IMPLANT
SUT PLAIN 0 NONE (SUTURE) IMPLANT
SUT PLAIN 2 0 XLH (SUTURE) ×2 IMPLANT
SUT VIC AB 0 CT1 36 (SUTURE) ×2 IMPLANT
SUT VIC AB 0 CTX 36 (SUTURE) ×3
SUT VIC AB 0 CTX36XBRD ANBCTRL (SUTURE) ×3 IMPLANT
TOWEL OR 17X24 6PK STRL BLUE (TOWEL DISPOSABLE) ×2 IMPLANT
TRAY FOLEY CATH 14FR (SET/KITS/TRAYS/PACK) ×2 IMPLANT
WATER STERILE IRR 1000ML POUR (IV SOLUTION) IMPLANT

## 2013-07-22 NOTE — Lactation Note (Signed)
This note was copied from the chart of Wanda Ralene BatheJenneh Mow. Lactation Consultation Note  Patient Name: [name] ZOXWR'UToday's Date: 07/22/2013 Reason for consult: Initial assessment of this experienced multipara who states she knows how to latch baby, express colostrum/milk and is aware of benefits of STS and cue feedings; she breastfed 2 other children for 6 months each.  LC observed baby well-latched to (L) breast in football position and mom denies any latching difficulty or nipple pain. LC encouraged review of Baby and Me pp 9, 14 and 20-25 for STS and BF information. LC provided [employer] Resource brochure and reviewed [facility] and list of community and web site resources.    Maternal Data Formula Feeding for Exclusion: Yes Reason for exclusion: Mother's choice to formula and breast feed on admission  Feeding Feeding Type: Breast Fed  LATCH Score/Interventions Latch: Grasps breast easily, tongue down, lips flanged, rhythmical sucking.  Audible Swallowing: A few with stimulation Intervention(s): Skin to skin Intervention(s): Skin to skin;Hand expression  Type of Nipple: Everted at rest and after stimulation  Comfort (Breast/Nipple): Soft / non-tender     Hold (Positioning): Assistance needed to correctly position infant at breast and maintain latch. Intervention(s): Position options;Support Pillows;Breastfeeding basics reviewed;Skin to skin  LATCH Score: 8  (assessment of feeding at beginning, per RN; [facility])  Lactation Tools Discussed/Used   STS, hand expression, cue feedings  Consult Status Consult Status: Follow-up Date: 07/23/13 Follow-up type: In-patient    [name], [name] [facility] 07/22/2013, 5:01 PM

## 2013-07-22 NOTE — Transfer of Care (Signed)
Immediate Anesthesia Transfer of Care Note  Patient: [name]  Procedure(s) Performed: Procedure(s): REPEAT CESAREAN SECTION (N/A)  Patient Location: PACU  Anesthesia Type:Spinal  Level of Consciousness: awake, alert  and oriented  Airway & Oxygen Therapy: Patient Spontanous Breathing  Post-op Assessment: Report given to PACU RN  Post vital signs: Reviewed  Complications: No apparent anesthesia complications

## 2013-07-22 NOTE — Consult Note (Signed)
Neonatology Note:   Attendance at C-section:    I was asked by Dr. Su Hiltoberts to attend this repeat C/S at term due to breech presentation. The mother is a G3P2 O pos, GBS pos with an uncomplicated pregnancy. Fetal ultrasound at 18 6/7 weeks showed right cardiac ventricle smaller than left, but repeat study at 36 5/7 weeks showed normal cardiac anatomy. ROM at delivery, fluid clear. Infant vigorous with good spontaneous cry and tone. However, at about 1.5 minutes, she began to cough up large amounts of clear secretions and continued to do so. Her tone decreased and HR dipped below 100 briefly several times. We bulb suctioned, but she continued to struggle to breathe due to copious secretions , so we did DeLee suctioning twice and got about 6-8 ml clear mucous out. We positioned and did some chest PT, and gave BBO2 due to dusky color. We placed a pulse oximeter and continued BBO2 until the saturation came up into the 90s. Her lungs sounded clear to auscultation and there are no cardiac murmurs. We continued to observe her until she was stable in room air for several minutes, taking her for skin to skin time at about 16 min of life. I instructed her nurse to keep the pulse oximeter on the baby and to take the baby to the CN if there were any further problems. I spoke with both parents in the DR.  Ap 8/8. Transferred to care of Pediatrician.   Doretha Souhristie C. Kristjan Derner, MD

## 2013-07-22 NOTE — Anesthesia Preprocedure Evaluation (Signed)

## 2013-07-22 NOTE — Anesthesia Postprocedure Evaluation (Signed)
  Anesthesia Post-op Note  Anesthesia Post Note  Patient: [name]  Procedure(s) Performed: Procedure(s) (LRB): REPEAT CESAREAN SECTION (N/A)  Anesthesia type: Spinal  Patient location: PACU  Post pain: Pain level controlled  Post assessment: Post-op Vital signs reviewed  Last Vitals:  Filed Vitals:   07/22/13 1345  BP: 117/81  Pulse: 66  Temp:   Resp: 16    Post vital signs: Reviewed  Level of consciousness: awake  Complications: No apparent anesthesia complications

## 2013-07-22 NOTE — H&P (Signed)
Wanda GreenlandJenneh B [name] is a 33 y.o. female, G3P2002 at 4139 2/7 weeks, presenting for scheduled repeat cesarean birth, with breech presentation.    Problem List: Previous Cesarean birth, with subsequent VBAC GBS positive Breech presentation Anemia (Hgb 9.3 at 28 weeks) PCN allergy, but can take amoxicillin  History of present pregnancy: Patient entered care at 15 2/7 weeks.   EDC of 07/27/13 was established by LMP and in agreement with US at 15 2/7 weeks.   Anatomy scan:  18 6/7 weeks, with questionable right cardiac ventricle smaller than the left and an posterior placenta.   Additional US evaluations:   Fetal echo 03/25/13 WNL 36 weeks:  Transverse presentation, normal fluid 36 5/7 weeks:  Complete breech presentation, normal fluid.  Significant prenatal events:  Flu vaccine 03/22/14, TDaP 04/26/14.  Planned VBAC, but elected C/S due to persistent abnormal presentation.  Last US at 36 5/7 weeks, with breech presentation.  Normal echo, in f/u from anatomy US with questionable right ventricle smaller than left.  Carpal tunnel noted at 31 weeks.   Last evaluation:  07/19/13  OB History   Grav Para Term Preterm Abortions TAB SAB Ect Mult Living   3 2        2     2008--Primary LTCS due to FTP, chorio, 40 weeks, 96 hour labor, 6+14, female, [facility], Dr. Normand Sloopillard 2011--VBAC, 40 2/7 weeks, 48 hour labor, 6+14, female,  [facility], Dr. Stefano GaulStringer  Past Medical Hx:  No significant medical hx, usual childhood illnesses.  On iron supplement.  Past Surgical History  Procedure Laterality Date  . Cesarean section     Family History: No significant family hx  Social History:  reports that she has never smoked. She does not have any smokeless tobacco history on file. She reports that she does not drink alcohol or use illicit drugs.  [place], high-school education, employed as Lawyer[employer]NA, of the [employer].  Husband is involved and supportive.   Prenatal Transfer Tool  Maternal Diabetes: No Genetic  Screening: Normal Quad screen Maternal Ultrasounds/Referrals: Normal--suspected right ventricle smaller than left, but normal f/u Fetal Ultrasounds or other Referrals:  Fetal echo WNL Maternal Substance Abuse:  No Significant Maternal Medications:  None Significant Maternal Lab Results: Lab values include: Group B Strep positive    ROS:  +FM, occasional contractions  Allergies  Allergen Reactions  . Penicillins Swelling and Rash    Amoxillin is OK.     Last menstrual period 10/20/2012.  Chest clear Heart RRR without murmur Abd gravid, NT, FH 37 cm Pelvic: Deferred Ext: WNL  FHR: 138 per doppler at last visit UCs:  No significant contractions  Prenatal labs: ABO, Rh:  O+ Antibody:  Neg Rubella:   Immune RPR:   NR HBsAg:   Neg HIV:   NR GBS:  Positive Sickle cell/Hgb electrophoresis:  Not noted in chart or in previous notes Pap:  WNL 04/28/12 GC:  Neg 02/01/13 Chlamydia:  Neg 02/01/13 Genetic screenings:  Normal quad screen Glucola:  WNL Other:  WNL   Assessment/Plan: IUP at 39 2/7 weeks Breech presentation Previous C/S, with subsequent VBAC GBS positive, sensitive to usual ATBs PCN allergic No Hgb electrophoresis in chart  [facility]: [facility] with Dr. Su Hiltoberts Routine [facility] pre-op orders  Nyra CapesLATHAM, VICKICNM, [place] 07/22/2013, 7:08 AM   Agree with above

## 2013-07-22 NOTE — Anesthesia Procedure Notes (Signed)
Spinal  Patient location during procedure: OR Start time: 07/22/2013 11:09 AM Staffing Performed by: anesthesiologist  Preanesthetic Checklist Completed: patient identified, site marked, surgical consent, pre-op evaluation, timeout performed, IV checked, risks and benefits discussed and monitors and equipment checked Spinal Block Patient position: sitting Prep: site prepped and draped and DuraPrep Patient monitoring: heart rate, cardiac monitor, continuous pulse ox and blood pressure Approach: midline Location: L3-4 Injection technique: single-shot Needle Needle type: Pencan  Needle gauge: 24 G Needle length: 9 cm Assessment Sensory level: T4 Additional Notes Clear free flow CSF on first attempt.  No paresthesia.  Patient tolerated procedure well with no apparent complications.  Jasmine DecemberA. Cassidy, MD

## 2013-07-22 NOTE — Preoperative (Signed)
Beta Blockers   Reason not to administer Beta Blockers:Not Applicable 

## 2013-07-23 LAB — CBC
HCT: 23.2 % — ABNORMAL LOW (ref 36.0–46.0)
Hemoglobin: 7.5 g/dL — ABNORMAL LOW (ref 12.0–15.0)
MCH: 25.3 pg — AB (ref 26.0–34.0)
MCHC: 32.3 g/dL (ref 30.0–36.0)
MCV: 78.1 fL (ref 78.0–100.0)
PLATELETS: 122 10*3/uL — AB (ref 150–400)
RBC: 2.97 MIL/uL — ABNORMAL LOW (ref 3.87–5.11)
RDW: 17.7 % — AB (ref 11.5–15.5)
WBC: 10.2 10*3/uL (ref 4.0–10.5)

## 2013-07-23 MED ORDER — FERROUS SULFATE 325 (65 FE) MG PO TABS
325.0000 mg | ORAL_TABLET | Freq: Two times a day (BID) | ORAL | Status: DC
  Administered 2013-07-23 – 2013-07-25 (×4): 325 mg via ORAL
  Filled 2013-07-23 (×4): qty 1

## 2013-07-23 NOTE — Progress Notes (Signed)
Subjective: Postpartum Day 1: Cesarean Delivery due to breech presentation Patient up ad lib, reports no syncope, dizziness or HA. + flatus.  Pt has been up walking.  Pt was reporting pain both at incision and cramping but had just received percocet - will continue to monitor.  Feeding:  Breastfeeding Contraceptive plan:  Undecided, offered to discuss, pt declined.  Objective: Vital signs in last 24 hours: Temp:  [97.4 F (36.3 C)-98.8 F (37.1 C)] 98.4 F (36.9 C) (03/07 1030) Pulse Rate:  [61-76] 75 (03/07 1030) Resp:  [14-20] 18 (03/07 1030) BP: (106-132)/(49-81) 107/67 mmHg (03/07 1030) SpO2:  [96 %-100 %] 98 % (03/07 1030) Weight:  [158 lb (71.668 kg)] 158 lb (71.668 kg) (03/06 1410)  Physical Exam:  General: alert, cooperative and no distress Lochia: appropriate Uterine Fundus: firm Incision: healing well DVT Evaluation: No evidence of DVT seen on physical exam. Negative Homan's sign. JP drain:   none   Recent Labs  07/22/13 0950 07/23/13 0629  HGB 10.2* 7.5*  HCT 32.0* 23.2*    Assessment/Plan: Status post Cesarean section day 1. Asymptomatic anemia - Fe ordered Discussed the option for blood transfusion, pt reports she does not want blood transfusion even if she becomes symptomatic. Doing well postoperatively.  Continue current care. Plan for discharge tomorrow    [name], [name] 07/23/2013, 12:01 PM

## 2013-07-23 NOTE — Anesthesia Postprocedure Evaluation (Signed)
Anesthesia Post Note  Patient: [name]  Procedure(s) Performed: Procedure(s) (LRB): REPEAT CESAREAN SECTION (N/A)  Anesthesia type: CSE  Patient location: Mother/Baby  Post pain: Pain level controlled  Post assessment: Post-op Vital signs reviewed  Last Vitals:  Filed Vitals:   07/23/13 0559  BP: 107/61  Pulse: 66  Temp: 36.7 C  Resp: 18    Post vital signs: Reviewed  Level of consciousness: awake  Complications: No apparent anesthesia complications

## 2013-07-23 NOTE — Addendum Note (Signed)
Addendum created 07/23/13 [id number] by Jhonnie GarnerBeth M Gilberto Streck, CRNA   Modules edited: Notes Section   Notes Section:  File: [id number]

## 2013-07-24 NOTE — Lactation Note (Signed)
This note was copied from the chart of [name] Byrd. Lactation Consultation Note  Patient Name: [name] [name]: 07/24/2013 Reason for consult: Follow-up assessment  Asked by RN to see Mom for engorgement. The right breast if firm, then left breast has some nodules but compressible since baby BF on this breast before my arrival. Engorgement care reviewed with Mom. Mom latched baby to right breast. Set up DEBP for Mom to post pump as needed for comfort, ice packs given with instructions. Advised to BF every 2-3 hour, not to miss any feedings. Massage to help empty breast well. Call for assist as needed.  Maternal Data    Feeding Feeding Type: Breast Fed Length of feed: 10 min  LATCH Score/Interventions Latch: Grasps breast easily, tongue down, lips flanged, rhythmical sucking.  Audible Swallowing: Spontaneous and intermittent  Type of Nipple: Everted at rest and after stimulation  Comfort (Breast/Nipple): Engorged, cracked, bleeding, large blisters, severe discomfort Problem noted: Engorgment Intervention(s): Ice     Hold (Positioning): No assistance needed to correctly position infant at breast. Intervention(s): Breastfeeding basics reviewed;Support Pillows;Position options;Skin to skin  LATCH Score: 8  Lactation Tools Discussed/Used Tools: Pump Breast pump type: Double-Electric Breast Pump   Consult Status Consult Status: Follow-up [name]: 07/25/13 Follow-up type: In-patient    Alfred LevinsGranger, Charelle Petrakis Ann 07/24/2013, 10:37 PM

## 2013-07-24 NOTE — Progress Notes (Signed)
Subjective: Postpartum Day 2: Cesarean Delivery due to repeat, breech presentation Patient up ad lib, reports no syncope or dizziness.   Knows to move slowly from lying to standing, due to low Hgb. Feeding:  Breast Contraceptive plan:  Undecided Baby being evaluated for jaundice  Objective: Vital signs in last 24 hours: Temp:  [98.1 F (36.7 C)-98.9 F (37.2 C)] 98.1 F (36.7 C) (03/08 0600) Pulse Rate:  [67-89] 86 (03/08 0809) Resp:  [18-19] 18 (03/08 0600) BP: (107-134)/(57-87) 126/83 mmHg (03/08 0809) SpO2:  [98 %] 98 % (03/07 1030)  Physical Exam:  General: alert Lochia: appropriate Uterine Fundus: firm Incision: Honeycomb dressing CDI DVT Evaluation: No evidence of DVT seen on physical exam. Negative Homan's sign. JP drain:   NA   Recent Labs  07/22/13 0950 07/23/13 0629  HGB 10.2* 7.5*  HCT 32.0* 23.2*  Orthostatics stable On Fe--has Rx at home, as well.  Assessment/Plan: Status post Cesarean section day 2 Anemia--stable hemodynamically, declines blood Doing well postoperatively.  Continue current care. Plan for discharge tomorrow    Nigel BridgemanLATHAM, Omer Puccinelli 07/24/2013, 8:34 AM

## 2013-07-25 ENCOUNTER — Encounter (HOSPITAL_COMMUNITY): Payer: Self-pay | Admitting: Obstetrics and Gynecology

## 2013-07-25 MED ORDER — IBUPROFEN 600 MG PO TABS: 600.0000 mg | ORAL_TABLET | Freq: Four times a day (QID) | ORAL | Status: DC

## 2013-07-25 MED ORDER — OXYCODONE-ACETAMINOPHEN 5-325 MG PO TABS: 1.0000 | ORAL_TABLET | ORAL | Status: DC | PRN

## 2013-07-25 NOTE — Lactation Note (Signed)
This note was copied from the chart of Wanda Ralene BatheJenneh Toscano. Lactation Consultation Note: Follow up visit with mom before DC. She reports that baby has been nursing well but her breasts are still full Reports that she has pumped 2 times since last night for comfort. Has WIC but does not plan to get pump from them. Hand pump given with instructions. Discussed engorgement prevention and treatment. Experienced BF mom. No questions at present. To call prn  Patient Name: [name] MVHQI'OToday's Date: 07/25/2013 Reason for consult: Follow-up assessment   Maternal Data    Feeding   LATCH Score/Interventions          Comfort (Breast/Nipple): Filling, red/small blisters or bruises, mild/mod discomfort Intervention(s): Ice  Problem noted: Filling Interventions (Filling): Massage;Frequent nursing;Hand pump        Lactation Tools Discussed/Used     Consult Status Consult Status: Complete    Pamelia HoitWeeks, Lanorris Kalisz D 07/25/2013, 9:55 AM

## 2013-07-25 NOTE — Discharge Summary (Signed)
Obstetric Discharge Summary Reason for Admission: cesarean section Prenatal Procedures: none Intrapartum Procedures: cesarean: low cervical, transverse Postpartum Procedures: none Complications-Operative and Postpartum: none Hemoglobin  Date Value Ref Range Status  07/23/2013 7.5* 12.0 - 15.0 g/dL Final     DELTA CHECK NOTED     REPEATED TO VERIFY     HCT  Date Value Ref Range Status  07/23/2013 23.2* 36.0 - 46.0 % Final    Physical Exam:  General: alert and cooperative Lochia: appropriate Uterine Fundus: firm Incision: healing well, no significant drainage DVT Evaluation: No evidence of DVT seen on physical exam. Physical Examination: Chest - clear to auscultation, no wheezes, rales or rhonchi, symmetric air entry Heart - normal rate and regular rhythm   Discharge Diagnoses: Term Pregnancy-delivered  Discharge Information: Date: 07/25/2013 Activity: pelvic rest Diet: routine Medications: PNV, Ibuprofen, Iron and Percocet Condition: stable Instructions: refer to practice specific booklet Discharge to: home Follow-up Information   [facility]. Schedule an appointment as soon as possible for a visit in 6 weeks.   Specialty:  Obstetrics and Gynecology   Contact information:   [address] [place] [id number] [phone]      Newborn Data: Live born female  Birth Weight: 7 lb 7.9 oz (3400 g) APGAR: 8, 8  Home with mother.  Chassidy Layson A 07/25/2013, 10:50 AM

## 2013-07-25 NOTE — Progress Notes (Signed)
Breast milk returned, verified with RN

## 2013-07-25 NOTE — Discharge Instructions (Signed)
Cesarean Delivery °Care After °Refer to this sheet in the next few weeks. These instructions provide you with information on caring for yourself after your procedure. Your health care provider may also give you specific instructions. Your treatment has been planned according to current medical practices, but problems sometimes occur. Call your health care provider if you have any problems or questions after you go home. °HOME CARE INSTRUCTIONS  °· Only take over-the-counter or prescription medications as directed by your health care provider. °· Do not drink alcohol, especially if you are breastfeeding or taking medication to relieve pain. °· Do not chew or smoke tobacco. °· Continue to use good perineal care. Good perineal care includes: °· Wiping your perineum from front to back. °· Keeping your perineum clean. °· Check your surgical cut (incision) daily for increased redness, drainage, swelling, or separation of skin. °· Clean your incision gently with soap and water every day, and then pat it dry. If your health care provider says it is OK, leave the incision uncovered. Use a bandage (dressing) if the incision is draining fluid or appears irritated. If the adhesive strips across the incision do not fall off within 7 days, carefully peel them off. °· Hug a pillow when coughing or sneezing until your incision is healed. This helps to relieve pain. °· Do not use tampons or douche until your health care provider says it is okay. °· Shower, wash your hair, and take tub baths as directed by your health care provider. °· Wear a well-fitting bra that provides breast support. °· Limit wearing support panties or control-top hose. °· Drink enough fluids to keep your urine clear or pale yellow. °· Eat high-fiber foods such as whole grain cereals and breads, brown rice, beans, and fresh fruits and vegetables every day. These foods may help prevent or relieve constipation. °· Resume activities such as climbing stairs,  driving, lifting, exercising, or traveling as directed by your health care provider. °· Talk to your health care provider about resuming sexual activities. This is dependent upon your risk of infection, your rate of healing, and your comfort and desire to resume sexual activity. °· Try to have someone help you with your household activities and your newborn for at least a few days after you leave the hospital. °· Rest as much as possible. Try to rest or take a nap when your newborn is sleeping. °· Increase your activities gradually. °· Keep all of your scheduled postpartum appointments. It is very important to keep your scheduled follow-up appointments. At these appointments, your health care provider will be checking to make sure that you are healing physically and emotionally. °SEEK MEDICAL CARE IF:  °· You are passing large clots from your vagina. Save any clots to show your health care provider. °· You have a foul smelling discharge from your vagina. °· You have trouble urinating. °· You are urinating frequently. °· You have pain when you urinate. °· You have a change in your bowel movements. °· You have increasing redness, pain, or swelling near your incision. °· You have pus draining from your incision. °· Your incision is separating. °· You have painful, hard, or reddened breasts. °· You have a severe headache. °· You have blurred vision or see spots. °· You feel sad or depressed. °· You have thoughts of hurting yourself or your newborn. °· You have questions about your care, the care of your newborn, or medications. °· You are dizzy or lightheaded. °· You have a rash. °· You   have pain, redness, or swelling at the site of the removed intravenous access (IV) tube. °· You have nausea or vomiting. °· You stopped breastfeeding and have not had a menstrual period within 12 weeks of stopping. °· You are not breastfeeding and have not had a menstrual period within 12 weeks of delivery. °· You have a fever. °SEEK  IMMEDIATE MEDICAL CARE IF: °· You have persistent pain. °· You have chest pain. °· You have shortness of breath. °· You faint. °· You have leg pain. °· You have stomach pain. °· Your vaginal bleeding saturates 2 or more sanitary pads in 1 hour. °MAKE SURE YOU:  °· Understand these instructions. °· Will watch your condition. °· Will get help right away if you are not doing well or get worse. °Document Released: 01/25/2002 Document Revised: 01/05/2013 Document Reviewed: 12/31/2011 °[employer]® Patient Information ©2014 [employer]. ° ° ° °

## 2013-07-26 NOTE — Op Note (Signed)
Cesarean Section Procedure Note  Indications: P3 at 39 wks for repeat c-section with breech presentation  Pre-operative Diagnosis: Breech presentation, Prior Cesareaan Section   Post-operative Diagnosis: Breech presentation, Prior Cesareaan Section  Procedure: CESAREAN SECTION  Surgeon: Osborn CohoAngela Jini Horiuchi, MD    Assistants: Nigel BridgemanVicki Latham, CNM  Anesthesia: Spinal  Procedure Details  The patient was taken to the operating room secondary to scheduled repeat c-section after the risks, benefits, complications, treatment options, and expected outcomes were discussed with the patient.  The patient concurred with the proposed plan, giving informed consent which was signed and witnessed. The patient was taken to Operating Room C-Section Suite, identified as [name] and the procedure verified as C-Section Delivery. A Time Out was held and the above information confirmed.  After induction of anesthesia by obtaining a spinal, the patient was prepped and draped in the usual sterile manner. A Pfannenstiel skin incision was made and carried down through the subcutaneous tissue to the underlying layer of fascia.  The fascia was incised bilaterally and extended transversely bilaterally with the Mayo scissors. Kocher clamps were placed on the inferior aspect of the fascial incision and the underlying rectus muscle was separated from the fascia. The same was done on the superior aspect of the fascial incision.  The peritoneum was identified, entered bluntly and extended manually.  An Alexis self-retaining retractor was placed.  The utero-vesical peritoneal reflection was incised transversely and the bladder flap was bluntly freed from the lower uterine segment. A low transverse uterine incision was made with the scalpel and extended bilaterally with the bandage scissors.  The infant was delivered in vertex position without difficulty.  After the umbilical cord was clamped and cut, the infant was handed to the  awaiting pediatricians.  Cord blood was obtained for evaluation.  The placenta was removed intact and appeared to be within normal limits. The uterus was cleared of all clots and debris. The uterine incision was closed with running interlocking sutures of 0 Vicryl and a second imbricating layer was performed as well.   Bilateral tubes and ovaries appeared to be within normal limits.  Good hemostasis was noted.  Copious irrigation was performed until clear.  The peritoneum was repaired with 2-0 chromic via a running suture.  The fascia was reapproximated with a running suture of 0 Vicryl. The subcutaneous tissue was reapproximated with 3 interrupted sutures of 2-0 plain.  The skin was reapproximated with a subcuticular suture of 3-0 monocryl.  Steristrips were applied.  Instrument, sponge, and needle counts were correct prior to abdominal closure and at the conclusion of the case.  The patient was awaiting transfer to the recovery room in good condition.  Findings: Live female infant with Apgars 8 at one minute and 8 at five minutes.  Normal appearing bilateral ovaries and fallopian tubes were noted.  Estimated Blood Loss:  See flowsheet         Drains: See flowsheet         Total IV Fluids: See flowsheet         Specimens to Pathology: Placenta         Complications:  None; patient tolerated the procedure well.         Disposition: PACU - hemodynamically stable.         Condition: stable  Attending Attestation: I performed the procedure.      tion

## 2013-07-28 ENCOUNTER — Inpatient Hospital Stay (HOSPITAL_COMMUNITY)
Admission: AD | Admit: 2013-07-28 | Discharge: 2013-07-28 | Disposition: A | Discharge status: Home / Self Care | Payer: 59 | Source: Ambulatory Visit | Attending: Obstetrics and Gynecology | Admitting: Obstetrics and Gynecology

## 2013-07-28 ENCOUNTER — Other Ambulatory Visit: Payer: Self-pay

## 2013-07-28 ENCOUNTER — Encounter (HOSPITAL_COMMUNITY): Payer: Self-pay | Admitting: General Practice

## 2013-07-28 DIAGNOSIS — O135 Gestational [pregnancy-induced] hypertension without significant proteinuria, complicating the puerperium: Secondary | ICD-10-CM | POA: Insufficient documentation

## 2013-07-28 DIAGNOSIS — IMO0002 Reserved for concepts with insufficient information to code with codable children: Secondary | ICD-10-CM | POA: Insufficient documentation

## 2013-07-28 DIAGNOSIS — R51 Headache: Secondary | ICD-10-CM | POA: Insufficient documentation

## 2013-07-28 LAB — CBC
HEMATOCRIT: 26.6 % — AB (ref 36.0–46.0)
Hemoglobin: 8.6 g/dL — ABNORMAL LOW (ref 12.0–15.0)
MCH: 25.3 pg — AB (ref 26.0–34.0)
MCHC: 32.3 g/dL (ref 30.0–36.0)
MCV: 78.2 fL (ref 78.0–100.0)
Platelets: 310 10*3/uL (ref 150–400)
RBC: 3.4 MIL/uL — AB (ref 3.87–5.11)
RDW: 17.9 % — ABNORMAL HIGH (ref 11.5–15.5)
WBC: 8.3 10*3/uL (ref 4.0–10.5)

## 2013-07-28 LAB — PROTEIN / CREATININE RATIO, URINE
CREATININE, URINE: 74.34 mg/dL
Protein Creatinine Ratio: 0.12 (ref 0.00–0.15)
Total Protein, Urine: 8.9 mg/dL

## 2013-07-28 LAB — COMPREHENSIVE METABOLIC PANEL
ALT: 33 U/L (ref 0–35)
AST: 24 U/L (ref 0–37)
Albumin: 2.6 g/dL — ABNORMAL LOW (ref 3.5–5.2)
Alkaline Phosphatase: 144 U/L — ABNORMAL HIGH (ref 39–117)
BUN: 14 mg/dL (ref 6–23)
CO2: 24 meq/L (ref 19–32)
Calcium: 8.8 mg/dL (ref 8.4–10.5)
Chloride: 105 mEq/L (ref 96–112)
Creatinine, Ser: 0.66 mg/dL (ref 0.50–1.10)
GFR calc non Af Amer: >90 mL/min (ref >90)
GLUCOSE: 79 mg/dL (ref 70–99)
POTASSIUM: 4 meq/L (ref 3.7–5.3)
SODIUM: 139 meq/L (ref 137–147)
Total Bilirubin: 0.2 mg/dL — ABNORMAL LOW (ref 0.3–1.2)
Total Protein: 6.2 g/dL (ref 6.0–8.3)

## 2013-07-28 LAB — URIC ACID: Uric Acid, Serum: 4.8 mg/dL (ref 2.4–7.0)

## 2013-07-28 MED ORDER — HYDROCHLOROTHIAZIDE 25 MG PO TABS: 25.0000 mg | ORAL_TABLET | Freq: Every day | ORAL | Status: DC

## 2013-07-28 NOTE — MAU Note (Signed)
Patient states she had a cesarean delivery on 3-6. Was seen by the home health RN today and her blood pressure was elevated and she has a slight headache. Sent to MAU for evaluation.

## 2013-07-28 NOTE — MAU Note (Signed)
Wanda GreenlandJenneh B [name] is a 33 y.o. female presenting for increased BP 6 days post [facility] at 148/90.Patient reports moderate headache on/off for 3 days without visual symptoms. She denies epigastric pain. Breastfeeding is going well. Post-op pain is well-controlled with Ibuprofen and percocet. Lower extremity edema is 3+ but same as day of discharge.  History OB History   Grav Para Term Preterm Abortions TAB SAB Ect Mult Living   3 3 1       3      History reviewed. No pertinent past medical history. Past Surgical History  Procedure Laterality Date  . Cesarean section    . Cesarean section N/A 07/22/2013    Procedure: REPEAT CESAREAN SECTION;  Surgeon: Purcell NailsAngela Y Roberts, MD;  Location: [facility];  Service: Obstetrics;  Laterality: N/A;      Blood pressure 138/94, pulse 88, temperature 98.8 F (37.1 C), temperature source Oral, resp. rate 16, last menstrual period 10/20/2012, unknown if currently breastfeeding.  General Appearance: Alert, appropriate appearance for age. No acute distress HEENT Exam: Grossly normal Chest/Respiratory Exam: Normal chest wall and respirations. Clear to auscultation Cardiovascular Exam: Regular rate and rhythm. S1, S2, no murmur Gastrointestinal Exam: soft, non-tender. Incision healing well Psychiatric Exam: Alert and oriented, appropriate affect Extremities: 3+ pitting edema. DTR 1/4 No clonus  ++++++++++++++++++++++++++++++++++++++++++++++++++++++++++++++++  Labs:  CBC: WBC 8.3   Hgb 8.6 (7.5)  Plt 310 (122) PCR: 1.2 CMP: normal   +++++++++++++++++++++++++++++++++++++++++++++++++++++++++++++++  Assessment and plan:  Post-partum hypertension without evidence of pre-eclampsia D/C home with HCTZ  25 mg qd for 7 days Follow-up in 1 week in the office PIH warning signs discussed   Silverio LaySandra Anhad Sheeley MD

## 2014-03-20 ENCOUNTER — Encounter (HOSPITAL_COMMUNITY): Payer: Self-pay | Admitting: General Practice

## 2014-11-09 ENCOUNTER — Other Ambulatory Visit: Payer: Self-pay | Admitting: Obstetrics and Gynecology

## 2014-11-09 ENCOUNTER — Inpatient Hospital Stay (HOSPITAL_COMMUNITY)
Admission: AD | Admit: 2014-11-09 | Discharge: 2014-11-09 | Disposition: A | Discharge status: Home / Self Care | Payer: 59 | Source: Ambulatory Visit | Attending: Obstetrics and Gynecology | Admitting: Obstetrics and Gynecology

## 2014-11-09 ENCOUNTER — Encounter (HOSPITAL_COMMUNITY): Payer: Self-pay | Admitting: *Deleted

## 2014-11-09 DIAGNOSIS — O039 Complete or unspecified spontaneous abortion without complication: Secondary | ICD-10-CM | POA: Insufficient documentation

## 2014-11-09 DIAGNOSIS — R109 Unspecified abdominal pain: Secondary | ICD-10-CM | POA: Diagnosis present

## 2014-11-09 LAB — CBC
HCT: 35.1 % — ABNORMAL LOW (ref 36.0–46.0)
HEMOGLOBIN: 11.4 g/dL — AB (ref 12.0–15.0)
MCH: 25.5 pg — AB (ref 26.0–34.0)
MCHC: 32.5 g/dL (ref 30.0–36.0)
MCV: 78.5 fL (ref 78.0–100.0)
PLATELETS: 178 10*3/uL (ref 150–400)
RBC: 4.47 MIL/uL (ref 3.87–5.11)
RDW: 14.7 % (ref 11.5–15.5)
WBC: 6 10*3/uL (ref 4.0–10.5)

## 2014-11-09 LAB — HCG, QUANTITATIVE, PREGNANCY: HCG, BETA CHAIN, QUANT, S: 83 m[IU]/mL — AB (ref <5)

## 2014-11-09 NOTE — MAU Note (Signed)
Pt reports she is having abd pain and vaginal bleeding for  The past 2 days.

## 2014-11-09 NOTE — MAU Note (Signed)
Urine in lab 

## 2014-11-09 NOTE — MAU Provider Note (Signed)
History    [name] is a 33y.o. [id number] who presents for repeat labs.  Patient states she was told by Dr. Lance Morin to come to MAU for her "abnormal labs."  Patient reports bleeding, at current, is light and it "was at it's heaviest on Monday.  Patient reports some cramping, but reports relief with ibuprofen.  Patient denies issues with urination or bowel movements.  Patient reports an office visit scheduled for Tuesday June 28th for an [place] and visit.    Patient Active Problem List   Diagnosis Date Noted  . S/P repeat low transverse C-section 07/22/2013    Chief Complaint  Patient presents with  . Vaginal Bleeding   HPI  OB History    Gravida Para Term Preterm AB TAB SAB Ectopic Multiple Living   4 3 1       3       No past medical history on file.  Past Surgical History  Procedure Laterality Date  . Cesarean section    . Cesarean section N/A 07/22/2013    Procedure: REPEAT CESAREAN SECTION;  Surgeon: Purcell Nails, MD;  Location: [facility];  Service: Obstetrics;  Laterality: N/A;    No family history on file.  History  Substance Use Topics  . Smoking status: Never Smoker   . Smokeless tobacco: Not on file  . Alcohol Use: No    Allergies:  Allergies  Allergen Reactions  . Penicillins Swelling and Rash    Amoxillin is OK.    No prescriptions prior to admission    ROS  See HPI Above Physical Exam   Blood pressure 136/103, pulse 74, temperature 98.4 F (36.9 C), resp. rate 18, height 5' (1.524 m), weight 65.772 kg (145 lb), last menstrual period 08/02/2014, unknown if currently breastfeeding.  Results for orders placed or performed during the hospital encounter of 11/09/14 (from the past 24 hour(s))  CBC     Status: Abnormal   Collection Time: 11/09/14  5:50 PM  Result Value Ref Range   WBC 6.0 4.0 - 10.5 K/uL   RBC 4.47 3.87 - 5.11 MIL/uL   Hemoglobin 11.4 (L) 12.0 - 15.0 g/dL   HCT 60.4 (L) 54.0 - 98.1 %   MCV 78.5 78.0 - 100.0 fL   MCH 25.5 (L) 26.0  - 34.0 pg   MCHC 32.5 30.0 - 36.0 g/dL   RDW 19.1 47.8 - 29.5 %   Platelets 178 150 - 400 K/uL  hCG, quantitative, pregnancy     Status: Abnormal   Collection Time: 11/09/14  5:50 PM  Result Value Ref Range   hCG, Beta Chain, Quant, S 83 (H) <5 mIU/mL    Physical Exam Deferred  ED Course  Assessment: 33y.o. Female Inevitable SAB  Plan: -Discussed results of labs including decrease from 241.7 down to 83 today -Discussed HgB level in good range -Bleeding precautions discussed -Pain mgmt discussed -Who and when to call for concerns related to SAB -Condolences given -Informed of need to follow up in office for repeat lab and visit, if necessary -Patient verbalized understanding, questions why SAB is occurring -Reassurances given -Discharged to home in stable condition  Luretha Eberly LYNN CNM, MSN 11/09/2014 8:55 PM

## 2015-09-14 ENCOUNTER — Encounter (HOSPITAL_COMMUNITY): Payer: Self-pay | Admitting: *Deleted

## 2016-01-23 DIAGNOSIS — Z3040 Encounter for surveillance of contraceptives, unspecified: Secondary | ICD-10-CM | POA: Diagnosis not present

## 2016-01-23 DIAGNOSIS — R102 Pelvic and perineal pain: Secondary | ICD-10-CM | POA: Diagnosis not present

## 2016-01-23 DIAGNOSIS — Z36 Encounter for antenatal screening of mother: Secondary | ICD-10-CM | POA: Diagnosis not present

## 2016-01-23 DIAGNOSIS — O0930 Supervision of pregnancy with insufficient antenatal care, unspecified trimester: Secondary | ICD-10-CM | POA: Diagnosis not present

## 2016-01-23 DIAGNOSIS — Z331 Pregnant state, incidental: Secondary | ICD-10-CM | POA: Diagnosis not present

## 2016-01-28 DIAGNOSIS — Z3A20 20 weeks gestation of pregnancy: Secondary | ICD-10-CM | POA: Diagnosis not present

## 2016-01-28 DIAGNOSIS — Z3492 Encounter for supervision of normal pregnancy, unspecified, second trimester: Secondary | ICD-10-CM | POA: Diagnosis not present

## 2016-01-28 DIAGNOSIS — Z36 Encounter for antenatal screening of mother: Secondary | ICD-10-CM | POA: Diagnosis not present

## 2016-01-28 DIAGNOSIS — Z3491 Encounter for supervision of normal pregnancy, unspecified, first trimester: Secondary | ICD-10-CM | POA: Diagnosis not present

## 2016-01-28 LAB — OB RESULTS CONSOLE HIV ANTIBODY (ROUTINE TESTING)
HIV: NONREACTIVE
HIV: NONREACTIVE

## 2016-01-28 LAB — OB RESULTS CONSOLE GC/CHLAMYDIA
Chlamydia: NEGATIVE
GC PROBE AMP, GENITAL: NEGATIVE

## 2016-01-28 LAB — OB RESULTS CONSOLE RPR
RPR: NONREACTIVE
RPR: NONREACTIVE

## 2016-01-28 LAB — OB RESULTS CONSOLE GBS: STREP GROUP B AG: POSITIVE

## 2016-01-28 LAB — OB RESULTS CONSOLE HEPATITIS B SURFACE ANTIGEN: HEP B S AG: NEGATIVE

## 2016-01-28 LAB — OB RESULTS CONSOLE RUBELLA ANTIBODY, IGM: Rubella: IMMUNE

## 2016-03-12 DIAGNOSIS — Z3A28 28 weeks gestation of pregnancy: Secondary | ICD-10-CM | POA: Diagnosis not present

## 2016-03-12 DIAGNOSIS — Z36 Encounter for antenatal screening for chromosomal anomalies: Secondary | ICD-10-CM | POA: Diagnosis not present

## 2016-03-12 DIAGNOSIS — R102 Pelvic and perineal pain: Secondary | ICD-10-CM | POA: Diagnosis not present

## 2016-03-12 DIAGNOSIS — Z3493 Encounter for supervision of normal pregnancy, unspecified, third trimester: Secondary | ICD-10-CM | POA: Diagnosis not present

## 2016-04-01 DIAGNOSIS — O99810 Abnormal glucose complicating pregnancy: Secondary | ICD-10-CM | POA: Diagnosis not present

## 2016-04-23 DIAGNOSIS — Z3493 Encounter for supervision of normal pregnancy, unspecified, third trimester: Secondary | ICD-10-CM | POA: Diagnosis not present

## 2016-04-23 DIAGNOSIS — Z3A36 36 weeks gestation of pregnancy: Secondary | ICD-10-CM | POA: Diagnosis not present

## 2016-05-15 DIAGNOSIS — O329XX9 Maternal care for malpresentation of fetus, unspecified, other fetus: Secondary | ICD-10-CM | POA: Diagnosis not present

## 2016-05-15 DIAGNOSIS — Z3A38 38 weeks gestation of pregnancy: Secondary | ICD-10-CM | POA: Diagnosis not present

## 2016-05-22 ENCOUNTER — Encounter (HOSPITAL_COMMUNITY): Payer: Self-pay | Admitting: *Deleted

## 2016-05-22 ENCOUNTER — Inpatient Hospital Stay (HOSPITAL_COMMUNITY)
Admission: AD | Admit: 2016-05-22 | Discharge: 2016-05-27 | DRG: 765 | Disposition: A | Discharge status: Home / Self Care | Payer: 59 | Source: Ambulatory Visit | Attending: Obstetrics and Gynecology | Admitting: Obstetrics and Gynecology

## 2016-05-22 DIAGNOSIS — O99119 Other diseases of the blood and blood-forming organs and certain disorders involving the immune mechanism complicating pregnancy, unspecified trimester: Secondary | ICD-10-CM

## 2016-05-22 DIAGNOSIS — D6959 Other secondary thrombocytopenia: Secondary | ICD-10-CM | POA: Diagnosis present

## 2016-05-22 DIAGNOSIS — O134 Gestational [pregnancy-induced] hypertension without significant proteinuria, complicating childbirth: Secondary | ICD-10-CM | POA: Diagnosis not present

## 2016-05-22 DIAGNOSIS — O34211 Maternal care for low transverse scar from previous cesarean delivery: Secondary | ICD-10-CM | POA: Diagnosis present

## 2016-05-22 DIAGNOSIS — O09529 Supervision of elderly multigravida, unspecified trimester: Secondary | ICD-10-CM

## 2016-05-22 DIAGNOSIS — Z880 Allergy status to penicillin: Secondary | ICD-10-CM | POA: Diagnosis not present

## 2016-05-22 DIAGNOSIS — O411230 Chorioamnionitis, third trimester, not applicable or unspecified: Secondary | ICD-10-CM | POA: Diagnosis not present

## 2016-05-22 DIAGNOSIS — O9912 Other diseases of the blood and blood-forming organs and certain disorders involving the immune mechanism complicating childbirth: Secondary | ICD-10-CM | POA: Diagnosis present

## 2016-05-22 DIAGNOSIS — O324XX0 Maternal care for high head at term, not applicable or unspecified: Secondary | ICD-10-CM | POA: Diagnosis not present

## 2016-05-22 DIAGNOSIS — O411290 Chorioamnionitis, unspecified trimester, not applicable or unspecified: Secondary | ICD-10-CM

## 2016-05-22 DIAGNOSIS — D696 Thrombocytopenia, unspecified: Secondary | ICD-10-CM

## 2016-05-22 DIAGNOSIS — Z98891 History of uterine scar from previous surgery: Secondary | ICD-10-CM

## 2016-05-22 DIAGNOSIS — O99824 Streptococcus B carrier state complicating childbirth: Secondary | ICD-10-CM | POA: Diagnosis present

## 2016-05-22 DIAGNOSIS — Z3A39 39 weeks gestation of pregnancy: Secondary | ICD-10-CM

## 2016-05-22 DIAGNOSIS — O133 Gestational [pregnancy-induced] hypertension without significant proteinuria, third trimester: Secondary | ICD-10-CM | POA: Diagnosis present

## 2016-05-22 HISTORY — DX: Headache: R51

## 2016-05-22 HISTORY — DX: Anemia, unspecified: D64.9

## 2016-05-22 HISTORY — DX: Headache, unspecified: R51.9

## 2016-05-22 HISTORY — DX: Essential (primary) hypertension: I10

## 2016-05-22 HISTORY — DX: Gestational (pregnancy-induced) hypertension without significant proteinuria, unspecified trimester: O13.9

## 2016-05-22 LAB — URINALYSIS, ROUTINE W REFLEX MICROSCOPIC
Bacteria, UA: NONE SEEN
Bilirubin Urine: NEGATIVE
Glucose, UA: NEGATIVE mg/dL
HGB URINE DIPSTICK: NEGATIVE
Ketones, ur: NEGATIVE mg/dL
NITRITE: NEGATIVE
Protein, ur: NEGATIVE mg/dL
Specific Gravity, Urine: 1.003 — ABNORMAL LOW (ref 1.005–1.030)
pH: 7 (ref 5.0–8.0)

## 2016-05-22 LAB — PROTEIN / CREATININE RATIO, URINE
CREATININE, URINE: 22 mg/dL
Total Protein, Urine: <6 mg/dL

## 2016-05-22 LAB — COMPREHENSIVE METABOLIC PANEL
ALK PHOS: 221 U/L — AB (ref 38–126)
ALT: 40 U/L (ref 14–54)
AST: 28 U/L (ref 15–41)
Albumin: 2.9 g/dL — ABNORMAL LOW (ref 3.5–5.0)
Anion gap: 8 (ref 5–15)
BUN: 8 mg/dL (ref 6–20)
CO2: 20 mmol/L — ABNORMAL LOW (ref 22–32)
Calcium: 8.7 mg/dL — ABNORMAL LOW (ref 8.9–10.3)
Chloride: 106 mmol/L (ref 101–111)
Creatinine, Ser: 0.51 mg/dL (ref 0.44–1.00)
GFR calc Af Amer: >60 mL/min (ref >60)
GFR calc non Af Amer: >60 mL/min (ref >60)
Glucose, Bld: 99 mg/dL (ref 65–99)
POTASSIUM: 3.6 mmol/L (ref 3.5–5.1)
Sodium: 134 mmol/L — ABNORMAL LOW (ref 135–145)
Total Bilirubin: 0.2 mg/dL — ABNORMAL LOW (ref 0.3–1.2)
Total Protein: 6.3 g/dL — ABNORMAL LOW (ref 6.5–8.1)

## 2016-05-22 LAB — CBC
HEMATOCRIT: 33 % — AB (ref 36.0–46.0)
HEMOGLOBIN: 11 g/dL — AB (ref 12.0–15.0)
MCH: 25.7 pg — ABNORMAL LOW (ref 26.0–34.0)
MCHC: 33.3 g/dL (ref 30.0–36.0)
MCV: 77.1 fL — ABNORMAL LOW (ref 78.0–100.0)
Platelets: 143 10*3/uL — ABNORMAL LOW (ref 150–400)
RBC: 4.28 MIL/uL (ref 3.87–5.11)
RDW: 17.5 % — AB (ref 11.5–15.5)
WBC: 6.8 10*3/uL (ref 4.0–10.5)

## 2016-05-22 LAB — TYPE AND SCREEN
ABO/RH(D): O POS
Antibody Screen: NEGATIVE

## 2016-05-22 LAB — RAPID HIV SCREEN (HIV 1/2 AB+AG)
HIV 1/2 Antibodies: NONREACTIVE
HIV-1 P24 ANTIGEN - HIV24: NONREACTIVE

## 2016-05-22 LAB — ABO/RH: ABO/RH(D): O POS

## 2016-05-22 MED ORDER — SOD CITRATE-CITRIC ACID 500-334 MG/5ML PO SOLN
30.0000 mL | ORAL | Status: DC | PRN
  Administered 2016-05-24: 30 mL via ORAL
  Filled 2016-05-22: qty 15

## 2016-05-22 MED ORDER — LACTATED RINGERS IV SOLN
500.0000 mL | INTRAVENOUS | Status: DC | PRN
  Administered 2016-05-23 – 2016-05-24 (×2): 500 mL via INTRAVENOUS

## 2016-05-22 MED ORDER — OXYTOCIN 40 UNITS IN LACTATED RINGERS INFUSION - SIMPLE MED
2.5000 [IU]/h | INTRAVENOUS | Status: DC
  Filled 2016-05-22: qty 1000

## 2016-05-22 MED ORDER — ONDANSETRON HCL 4 MG/2ML IJ SOLN
4.0000 mg | Freq: Four times a day (QID) | INTRAMUSCULAR | Status: DC | PRN
Start: 2016-05-22 — End: 2016-05-24

## 2016-05-22 MED ORDER — TERBUTALINE SULFATE 1 MG/ML IJ SOLN: 0.2500 mg | Freq: Once | INTRAMUSCULAR | Status: DC | PRN

## 2016-05-22 MED ORDER — ACETAMINOPHEN 325 MG PO TABS
650.0000 mg | ORAL_TABLET | ORAL | Status: DC | PRN
  Administered 2016-05-22 – 2016-05-26 (×5): 650 mg via ORAL
  Filled 2016-05-22 (×6): qty 2

## 2016-05-22 MED ORDER — LACTATED RINGERS IV SOLN
INTRAVENOUS | Status: DC
  Administered 2016-05-23 – 2016-05-24 (×6): via INTRAVENOUS

## 2016-05-22 MED ORDER — LIDOCAINE HCL (PF) 1 % IJ SOLN: 30.0000 mL | INTRAMUSCULAR | Status: DC | PRN

## 2016-05-22 MED ORDER — OXYTOCIN BOLUS FROM INFUSION: 500.0000 mL | Freq: Once | INTRAVENOUS | Status: DC

## 2016-05-22 MED ORDER — FLEET ENEMA 7-19 GM/118ML RE ENEM: 1.0000 | ENEMA | RECTAL | Status: DC | PRN

## 2016-05-22 MED ORDER — OXYTOCIN 40 UNITS IN LACTATED RINGERS INFUSION - SIMPLE MED
1.0000 m[IU]/min | INTRAVENOUS | Status: DC
  Administered 2016-05-22: 3 m[IU]/min via INTRAVENOUS
  Administered 2016-05-22: 1 m[IU]/min via INTRAVENOUS
  Administered 2016-05-22: 2 m[IU]/min via INTRAVENOUS
  Administered 2016-05-23: 4 m[IU]/min via INTRAVENOUS
  Administered 2016-05-23: 5 m[IU]/min via INTRAVENOUS

## 2016-05-22 MED ORDER — VANCOMYCIN HCL IN DEXTROSE 1-5 GM/200ML-% IV SOLN
1000.0000 mg | Freq: Two times a day (BID) | INTRAVENOUS | Status: DC
  Administered 2016-05-22 – 2016-05-25 (×6): 1000 mg via INTRAVENOUS
  Filled 2016-05-22 (×6): qty 200

## 2016-05-22 MED ORDER — FENTANYL CITRATE (PF) 100 MCG/2ML IJ SOLN: 50.0000 ug | INTRAMUSCULAR | Status: DC | PRN

## 2016-05-22 NOTE — H&P (Signed)
[name] is a 36 y.o. female presenting for labor induction for gestational hypertension at [redacted] weeks EGA.  LMP 12/06/2015.  EDC of 05/29/2016 by second trimester Ultrasound.  Preeclampsia labs were normal. Patient noted to have high BPs in 140-150s/90s to low 100s.  Patient also reported a headache for several days.   Patient with history of 2 cesarean sections (C/S) and 1 successful VBAC between the two C/S, last C/S was performed for breech presentation, had 2 layer uterine closures on both cesarean sections, patient desiring trial of labor after cesarean section (TOLAC).   OB History    Gravida Para Term Preterm AB Living   5 3 1     3    SAB TAB Ectopic Multiple Live Births           3     Past Medical History:  Diagnosis Date  . Anemia   . Headache   . Hypertension   . Pregnancy induced hypertension    Past Surgical History:  Procedure Laterality Date  . CESAREAN SECTION    . CESAREAN SECTION N/A 07/22/2013   Procedure: REPEAT CESAREAN SECTION;  Surgeon: Purcell NailsAngela Y Roberts, MD;  Location: [facility];  Service: Obstetrics;  Laterality: N/A;   Family History: family history is not on file. Social History:  reports that she has never smoked. She has never used smokeless tobacco. She reports that she does not drink alcohol or use drugs.     Maternal Diabetes: No Genetic Screening: Declined Maternal Ultrasounds/Referrals: Normal Fetal Ultrasounds or other Referrals:  None Maternal Substance Abuse:  No Significant Maternal Medications:  None Significant Maternal Lab Results:  None Other Comments:  Late entry to prenatal care in second trimester.   ROS  Constitutional: Denies fevers/chills Cardiovascular: Denies chest pain or palpitations Pulmonary: Denies coughing or wheezing Gastrointestinal: Denies nausea, vomiting or diarrhea Genitourinary: Denies pelvic pain, unusual vaginal bleeding, unusual vaginal discharge, dysuria, urgency or frequency.  Musculoskeletal: Denies muscle  or joint aches and pain.  Neurology: Denies abnormal sensations such as tingling or numbness. With headache.   History Dilation: 1 Effacement (%): 30 Station: -3 Exam by:: [name] Blood pressure (!) 150/84, pulse 87, height 5' (1.524 m), weight 72.1 kg (159 lb), unknown if currently breastfeeding.  Foley bulb placed into cervix at about 1750, 35 cc NS instilled into balloon.    Exam Physical Exam  Constitutional: She is oriented to person, place, and time. She appears well-developed and well-nourished.  HENT:  Head: Normocephalic and atraumatic.  Neck: Normal range of motion.  Cardiovascular: Normal rate, regular rhythm and normal heart sounds.   Respiratory: Effort normal and breath sounds normal.  GI: Soft. Bowel sounds are normal. Abdomen is gravid.  Neurological: She is alert and oriented to person, place, and time.  Skin: Skin is warm and dry.  Psychiatric: She has a normal mood and affect. Her behavior is normal.    Bedside ultrasound: cephalic presentation.  EFW by Leopolds 7 lbs.  CBC    Component Value Date/Time   WBC 6.8 05/22/2016 1325   RBC 4.28 05/22/2016 1325   HGB 11.0 (L) 05/22/2016 1325   HCT 33.0 (L) 05/22/2016 1325   PLT 143 (L) 05/22/2016 1325   MCV 77.1 (L) 05/22/2016 1325   MCH 25.7 (L) 05/22/2016 1325   MCHC 33.3 05/22/2016 1325   RDW 17.5 (H) 05/22/2016 1325   LYMPHSABS 1.2 10/22/2006 0548   MONOABS 0.7 10/22/2006 0548   EOSABS 0.0 10/22/2006 0548   BASOSABS 0.0 10/22/2006  1610   CMP     Component Value Date/Time   NA 134 (L) 05/22/2016 1325   K 3.6 05/22/2016 1325   CL 106 05/22/2016 1325   CO2 20 (L) 05/22/2016 1325   GLUCOSE 99 05/22/2016 1325   BUN 8 05/22/2016 1325   CREATININE 0.51 05/22/2016 1325   CALCIUM 8.7 (L) 05/22/2016 1325   PROT 6.3 (L) 05/22/2016 1325   ALBUMIN 2.9 (L) 05/22/2016 1325   AST 28 05/22/2016 1325   ALT 40 05/22/2016 1325   ALKPHOS 221 (H) 05/22/2016 1325   BILITOT 0.2 (L) 05/22/2016 1325   GFRNONAA >60  05/22/2016 1325   GFRAA >60 05/22/2016 1325   05/23/15: Urine spot protein cr ratio: too low to calculate.     Prenatal labs: ABO, Rh: --/--/O POS (01/04 1325) Antibody: PENDING (01/04 1325) Rubella:  Immune RPR: Nonreactive (09/11 0000)  HBsAg:   Negative HIV: Non-reactive (09/11 0000)  GBS: Positive (09/11 0000)  Pap 11/01/14: Negative/negative.  Glucose test: elevated 1 hr GCT, normal 3 hr GTT.  Assessment/Plan: 36 y/o G5P3013 @ [redacted] weeks EGA here for induction for gestational hypertension, for TOLAC,  I discussed with patient risks, benefits and alternatives of TOLAC including risks of uterine rupture of about 1% and the accompanying morbidity and mortality risk for her and her fetus.  Patient expressed understanding and desired to proceed with labor induction by foley balloon then pitocin.  She has signed the VBAC consent form in the office already.  She understood risk of labor induction including greater risk of cesarean delivery compared to scenario of presenting in spontaneous labor.  All her questions were answered and she agrees to proceed with the induction.  She did not desire a repeat cesarean delivery.   -Vancomycin for GBS positive, severe allergy to penicillin, no GBS sensitivities on record.   -Plan for pitocin after foley bulb expelled.   -Tylenol for headache.   Wanda Felix, MD.  05/22/2016, 6:43 PM

## 2016-05-22 NOTE — MAU Provider Note (Signed)
Chief Complaint:  Hypertension   First Provider Initiated Contact with Patient 05/22/16 1209     HPI: [name] is a 36 y.o. G5P1003 at 108w5dwho presents to maternity admissions reporting hypertension and headache.  Headache started yesterday.. She reports good fetal movement, denies LOF, vaginal bleeding, vaginal itching/burning, urinary symptoms, h/a, dizziness, n/v, diarrhea, constipation or fever/chills.  She denies headache, visual changes or RUQ abdominal pain.  Hypertension  This is a new problem. The current episode started today. Associated symptoms include headaches. Pertinent negatives include no anxiety, blurred vision, chest pain, palpitations, peripheral edema or shortness of breath. There are no associated agents to hypertension. Past treatments include nothing. There are no compliance problems.    RN Note: Sent from [employer] office for further evaluation of BP; cervix is 1 cm today; denies any vaginal leaking or spotting; has a headache today but denies blurred vision;  Past Medical History: Past Medical History:  Diagnosis Date  . Anemia   . Headache   . Hypertension   . Pregnancy induced hypertension     Past obstetric history: OB History  Gravida Para Term Preterm AB Living  5 3 1     3   SAB TAB Ectopic Multiple Live Births          3    # Outcome Date GA Lbr Len/2nd Weight Sex Delivery Anes PTL Lv  5 Current           4 Term 07/22/13 [redacted]w[redacted]d   F CS-LTranv Spinal  LIV  3 Gravida           2 Para      VBAC   LIV  1 Para      CS-LTranv   LIV      Past Surgical History: Past Surgical History:  Procedure Laterality Date  . CESAREAN SECTION    . CESAREAN SECTION N/A 07/22/2013   Procedure: REPEAT CESAREAN SECTION;  Surgeon: Purcell Nails, MD;  Location: [facility];  Service: Obstetrics;  Laterality: N/A;    Family History: No family history on file.  Social History: Social History  Substance Use Topics  . Smoking status: Never Smoker  . Smokeless  tobacco: Never Used  . Alcohol use No    Allergies:  Allergies  Allergen Reactions  . Penicillins Swelling and Rash    Amoxillin is OK.    Meds:  Prescriptions Prior to Admission  Medication Sig Dispense Refill Last Dose  . acetaminophen (TYLENOL) 500 MG tablet Take 500 mg by mouth every 6 (six) hours as needed for mild pain or headache.   Past Month at Unknown time  . ferrous sulfate 325 (65 FE) MG tablet Take 325 mg by mouth 2 (two) times daily with a meal.   07/28/2013 at Unknown time  . hydrochlorothiazide (HYDRODIURIL) 25 MG tablet Take 1 tablet (25 mg total) by mouth daily. 7 tablet 0   . ibuprofen (ADVIL,MOTRIN) 600 MG tablet Take 1 tablet (600 mg total) by mouth every 6 (six) hours. 30 tablet 0 07/28/2013 at Unknown time  . oxyCODONE-acetaminophen (PERCOCET/ROXICET) 5-325 MG per tablet Take 1-2 tablets by mouth every 4 (four) hours as needed for severe pain (moderate - severe pain). 30 tablet 0 07/28/2013 at Unknown time  . Prenatal Vit-Fe Fumarate-FA (PRENATAL MULTIVITAMIN) TABS tablet Take 1 tablet by mouth daily at 12 noon.   07/28/2013 at Unknown time    I have reviewed patient's Past Medical Hx, Surgical Hx, Family Hx, Social Hx, medications and allergies.  ROS:  Review of Systems  Constitutional: Negative for chills and fever.  Eyes: Negative for blurred vision and visual disturbance.  Respiratory: Negative for shortness of breath.   Cardiovascular: Negative for chest pain and palpitations.  Gastrointestinal: Negative for abdominal pain, constipation, diarrhea, nausea and vomiting.  Genitourinary: Negative for dysuria, pelvic pain and vaginal bleeding.  Neurological: Positive for headaches. Negative for dizziness, seizures, speech difficulty, weakness and numbness.   Other systems negative  Physical Exam  Patient Vitals for the past 24 hrs:  BP Pulse  05/22/16 1258 140/96 97  05/22/16 1248 139/98 118  05/22/16 1238 (!) 135/103 108  05/22/16 1228 150/99 103    Vitals:   05/22/16 1413 05/22/16 1428 05/22/16 1458 05/22/16 1528  BP: 141/94 148/98 135/85 154/95  Pulse: 93 104 99 93    Constitutional: Well-developed, well-nourished female in no acute distress.  Cardiovascular: normal rate and rhythm Respiratory: normal effort, clear to auscultation bilaterally GI: Abd soft, non-tender, gravid appropriate for gestational age.   No rebound or guarding. MS: Extremities nontender, no edema, normal ROM Neurologic: Alert and oriented x 4.  DTRs 3+ with no clonus GU: Neg CVAT.    FHT:  Baseline 140 , moderate variability, accelerations present, no decelerations Contractions:  Irregular     Labs:    Results for orders placed or performed during the hospital encounter of 05/22/16 (from the past 24 hour(s))  Urinalysis, Routine w reflex microscopic     Status: Abnormal   Collection Time: 05/22/16 12:07 PM  Result Value Ref Range   Color, Urine STRAW (A) YELLOW   APPearance CLEAR CLEAR   Specific Gravity, Urine 1.003 (L) 1.005 - 1.030   pH 7.0 5.0 - 8.0   Glucose, UA NEGATIVE NEGATIVE mg/dL   Hgb urine dipstick NEGATIVE NEGATIVE   Bilirubin Urine NEGATIVE NEGATIVE   Ketones, ur NEGATIVE NEGATIVE mg/dL   Protein, ur NEGATIVE NEGATIVE mg/dL   Nitrite NEGATIVE NEGATIVE   Leukocytes, UA MODERATE (A) NEGATIVE   RBC / HPF 0-5 0 - 5 RBC/hpf   WBC, UA 0-5 0 - 5 WBC/hpf   Bacteria, UA NONE SEEN NONE SEEN   Squamous Epithelial / LPF 0-5 (A) NONE SEEN   Mucous PRESENT   Protein / creatinine ratio, urine     Status: None   Collection Time: 05/22/16 12:07 PM  Result Value Ref Range   Creatinine, Urine 22.00 mg/dL   Total Protein, Urine <6 mg/dL   Protein Creatinine Ratio        0.00 - 0.15 mg/mg[Cre]  CBC     Status: Abnormal   Collection Time: 05/22/16  1:25 PM  Result Value Ref Range   WBC 6.8 4.0 - 10.5 K/uL   RBC 4.28 3.87 - 5.11 MIL/uL   Hemoglobin 11.0 (L) 12.0 - 15.0 g/dL   HCT 16.1 (L) 09.6 - 04.5 %   MCV 77.1 (L) 78.0 - 100.0 fL   MCH  25.7 (L) 26.0 - 34.0 pg   MCHC 33.3 30.0 - 36.0 g/dL   RDW 40.9 (H) 81.1 - 91.4 %   Platelets 143 (L) 150 - 400 K/uL  Comprehensive metabolic panel     Status: Abnormal   Collection Time: 05/22/16  1:25 PM  Result Value Ref Range   Sodium 134 (L) 135 - 145 mmol/L   Potassium 3.6 3.5 - 5.1 mmol/L   Chloride 106 101 - 111 mmol/L   CO2 20 (L) 22 - 32 mmol/L   Glucose, Bld 99 65 - 99  mg/dL   BUN 8 6 - 20 mg/dL   Creatinine, Ser 7.560.51 0.44 - 1.00 mg/dL   Calcium 8.7 (L) 8.9 - 10.3 mg/dL   Total Protein 6.3 (L) 6.5 - 8.1 g/dL   Albumin 2.9 (L) 3.5 - 5.0 g/dL   AST 28 15 - 41 U/L   ALT 40 14 - 54 U/L   Alkaline Phosphatase 221 (H) 38 - 126 U/L   Total Bilirubin 0.2 (L) 0.3 - 1.2 mg/dL   GFR calc non Af Amer >60 >60 mL/min   GFR calc Af Amer >60 >60 mL/min   Anion gap 8 5 - 15     Imaging:  No results found.  MAU Course/MDM: I have ordered labs and reviewed results. All normal except platelets NST reviewed Consult Dr Sallye OberKulwa with presentation, exam findings and test results.  Treatments in MAU included evaluation by Dr Sallye OberKulwa.    Assessment: SIUP at 3057w5d Gestational hypertension Mild thrombocytopenia   Plan: Admit per Dr Sallye OberKulwa  Wynelle BourgeoisMarie Lekeshia Kram CNM, MSN Certified Nurse-Midwife 05/22/2016 1:02 PM

## 2016-05-22 NOTE — Progress Notes (Signed)
Subjective: Pt feeling contractions.  Foley bulb out. Objective: BP (!) 143/86   Pulse 96   Temp 98 F (36.7 C) (Oral)   Resp 18   Ht 5' (1.524 m)   Wt 72.1 kg (159 lb)   BMI 31.05 kg/m  No intake/output data recorded. No intake/output data recorded.  FHT: Category 1 UC:   irregular, every 10-15 minutes SVE:   4-5/70/-3 Used scalp to needle bag of waters without success.     Assessment:  Pt is a 36 yo G5P1003 at 37.5 gestational hypertension Previous LTCS x2 with successful vbac inbetween Cat 1 strip  Plan: Start low dose pitocin.  When head lower with AROM and place IUPC.  Kenney HousemanNancy Jean Prothero CNM, MSN 05/22/2016, 9:30 PM

## 2016-05-22 NOTE — MAU Note (Signed)
Sent from OB's office for further evaluation of BP; cervix is 1 cm today; denies any vaginal leaking or spotting; has a headache today but denies blurred vision;

## 2016-05-23 ENCOUNTER — Encounter (HOSPITAL_COMMUNITY): Payer: Self-pay

## 2016-05-23 ENCOUNTER — Inpatient Hospital Stay (HOSPITAL_COMMUNITY): Payer: 59 | Admitting: Anesthesiology

## 2016-05-23 LAB — COMPREHENSIVE METABOLIC PANEL
ALBUMIN: 3 g/dL — AB (ref 3.5–5.0)
ALK PHOS: 234 U/L — AB (ref 38–126)
ALT: 39 U/L (ref 14–54)
ANION GAP: 10 (ref 5–15)
AST: 29 U/L (ref 15–41)
BUN: <5 mg/dL — ABNORMAL LOW (ref 6–20)
CHLORIDE: 106 mmol/L (ref 101–111)
CO2: 19 mmol/L — AB (ref 22–32)
Calcium: 8.7 mg/dL — ABNORMAL LOW (ref 8.9–10.3)
Creatinine, Ser: 0.53 mg/dL (ref 0.44–1.00)
GFR calc non Af Amer: >60 mL/min (ref >60)
GLUCOSE: 110 mg/dL — AB (ref 65–99)
Potassium: 3.5 mmol/L (ref 3.5–5.1)
SODIUM: 135 mmol/L (ref 135–145)
Total Bilirubin: 0.6 mg/dL (ref 0.3–1.2)
Total Protein: 6.3 g/dL — ABNORMAL LOW (ref 6.5–8.1)

## 2016-05-23 LAB — CBC
HCT: 36.3 % (ref 36.0–46.0)
HEMOGLOBIN: 12 g/dL (ref 12.0–15.0)
MCH: 25.7 pg — AB (ref 26.0–34.0)
MCHC: 33.1 g/dL (ref 30.0–36.0)
MCV: 77.7 fL — ABNORMAL LOW (ref 78.0–100.0)
PLATELETS: 136 10*3/uL — AB (ref 150–400)
RBC: 4.67 MIL/uL (ref 3.87–5.11)
RDW: 17.5 % — ABNORMAL HIGH (ref 11.5–15.5)
WBC: 12.7 10*3/uL — ABNORMAL HIGH (ref 4.0–10.5)

## 2016-05-23 LAB — RPR: RPR Ser Ql: NONREACTIVE

## 2016-05-23 MED ORDER — OXYTOCIN 40 UNITS IN LACTATED RINGERS INFUSION - SIMPLE MED: 1.0000 m[IU]/min | INTRAVENOUS | Status: DC

## 2016-05-23 MED ORDER — LIDOCAINE HCL (PF) 1 % IJ SOLN
INTRAMUSCULAR | Status: DC | PRN
  Administered 2016-05-23: 4 mL
  Administered 2016-05-23: 6 mL via EPIDURAL

## 2016-05-23 MED ORDER — DIPHENHYDRAMINE HCL 50 MG/ML IJ SOLN: 12.5000 mg | INTRAMUSCULAR | Status: DC | PRN

## 2016-05-23 MED ORDER — LACTATED RINGERS IV SOLN
500.0000 mL | Freq: Once | INTRAVENOUS | Status: AC
  Administered 2016-05-23: 500 mL via INTRAVENOUS

## 2016-05-23 MED ORDER — PHENYLEPHRINE 40 MCG/ML (10ML) SYRINGE FOR IV PUSH (FOR BLOOD PRESSURE SUPPORT): 80.0000 ug | PREFILLED_SYRINGE | INTRAVENOUS | Status: DC | PRN

## 2016-05-23 MED ORDER — FENTANYL 2.5 MCG/ML BUPIVACAINE 1/10 % EPIDURAL INFUSION (WH - ANES)
14.0000 mL/h | INTRAMUSCULAR | Status: DC | PRN
  Administered 2016-05-23 – 2016-05-24 (×3): 14 mL/h via EPIDURAL
  Filled 2016-05-23 (×3): qty 100

## 2016-05-23 MED ORDER — EPHEDRINE 5 MG/ML INJ: 10.0000 mg | INTRAVENOUS | Status: DC | PRN

## 2016-05-23 MED ORDER — PHENYLEPHRINE 40 MCG/ML (10ML) SYRINGE FOR IV PUSH (FOR BLOOD PRESSURE SUPPORT)
80.0000 ug | PREFILLED_SYRINGE | INTRAVENOUS | Status: DC | PRN
  Filled 2016-05-23: qty 10

## 2016-05-23 NOTE — Progress Notes (Signed)
Maximino GreenlandJenneh B Paganelli MRN: [id number]  Subjective: -Patient reports increased discomfort with contractions.  Declines epidural stating ongoing back pain from epidural in past.    Objective: BP (!) 146/89   Pulse 89   Temp 99.4 F (37.4 C) (Oral)   Resp 18   Ht 5' (1.524 m)   Wt 72.1 kg (159 lb)   LMP 12/06/2015   BMI 31.05 kg/m  No intake/output data recorded. No intake/output data recorded.  Fetal Monitoring: FHT: 150 bpm, Mod Var, -Decels, +Accels UC: Q4-796min, palpates moderate, 90mmHg    Vaginal Exam: SVE:   Dilation: 4.5 Effacement (%): 50 Station: -3, Ballotable Exam by:: j Ruthann Angulo cnm Membranes:AROM x  Internal Monitors: IUPC   Augmentation/Induction: Pitocin:386mUn/min  Cytotec: None  Assessment:  IUP at 39.1wks Cat I FT  IOL  Plan: -Continue pitocin titration -Discussed labor and pain mgmt.  Suggestions for coping given including IV pain medication, birthing ball, and position changes.  -Continue other mgmt as ordered -Dr. Alinda SierrasAR to be updated   Valma CavaJessica L Massimo Hartland,MSN, CNM 05/23/2016, 10:55 AM

## 2016-05-23 NOTE — Progress Notes (Signed)
Subjective: Called to room by RN for decreased variability. Pitocin off, O2 per mask at 10 liters. Pt on ball breathing with contractions. Objective: BP (!) 145/84   Pulse 93   Temp 98.5 F (36.9 C) (Oral)   Resp 20   Ht 5' (1.524 m)   Wt 72.1 kg (159 lb)   BMI 31.05 kg/m  No intake/output data recorded. No intake/output data recorded.  FHT: Category 1 UC:   regular, every 3 minutes SVE:   Dilation: 4.5 Effacement (%): 50 Station: -3 Exam by:: [name], RNC Pitocin at turned off, was at 15 mu MVUs 180  Assessment:  Pt is a 36 yo G5P3013 at 37.6 IUP in active labor Cat 1 strip  Plan: PT placed in hands and knees. Will restart pitocin slowly.  O2 off.  Kenney HousemanNancy Jean Anwita [name] CNM, MSN 05/23/2016, 5:49 AM

## 2016-05-23 NOTE — Progress Notes (Signed)
[name] MRN: [id number]  Subjective: -Strip reviewed.  In room to assess.  Patient reports some relief with nitrous usage.   Objective: BP 134/83   Pulse 87   Temp 98.9 F (37.2 C) (Oral)   Resp 18   Ht 5' (1.524 m)   Wt 72.1 kg (159 lb)   LMP 12/06/2015   SpO2 100%   BMI 31.05 kg/m  No intake/output data recorded. No intake/output data recorded.  Fetal Monitoring: FHT: 150 bpm, Mod Var, +Decels, +Accels UC: Q2-364min, palpates moderate    Vaginal Exam: SVE:   Dilation: 4.5 Effacement (%): 50 Station: -3 Exam by:: j Mehr Depaoli cnm Membranes:AROM x 17hrs Internal Monitors: IUPC in place  Augmentation/Induction: Pitocin:8313mUn/min  Cytotec: None  Assessment:  IUP at 39.1wks Cat II FT  IOL GHTN TOLAC  Plan: -Position change with correction of Cat II FT noted -IUPC flushed with good results -Dr. Lance MorinA. [name] consulted and updated on patient status *Reports she will evaluate patient  -Patient updated on MD plan -Continue other mgmt as ordered   Valma CavaJessica L Maleia Weems,MSN, CNM 05/23/2016, 5:18 PM

## 2016-05-23 NOTE — Anesthesia Pain Management Evaluation Note (Signed)
  CRNA Pain Management Visit Note  Patient: [name], 36 y.o., female  "Hello [facility] am a [facility] the [facility]. We have an anesthesia team available at all times to provide care throughout the hospital, including epidural management and anesthesia for C-section. I don't know your plan for the delivery whether it a natural birth, water birth, IV sedation, nitrous supplementation, doula or epidural, but we want to meet your pain goals."   1.Was your pain managed to your expectations on prior hospitalizations?   Yes   2.What is your expectation for pain management during this hospitalization?     Labor support without medications  3.How can we help you reach that goal? Be available if needed  Record the patient's initial score and the patient's pain goal.   Pain: 7  Pain Goal: 10 [facility] wants you to be able to say your pain was always managed very well.  Shaconda Hajduk 05/23/2016

## 2016-05-23 NOTE — Progress Notes (Addendum)
Assuming care of [name], 36 yo [id number] @ 39.1 wks [facility]. Patient with history of 2 cesarean sections and 1 successful VBAC between the two C/S, last C/S performed for breech presentation, had 2 layer uterine closures with both cesarean sections. Desires TOLAC.  Subjective: Comfortable w/ epidural. Denies h/a, visual changes, epigastric pain or difficulty breathing.   Objective: BP (!) 143/86   Pulse 98   Temp 99.3 F (37.4 C) (Axillary)   Resp 17   Ht 5' (1.524 m)   Wt 72.1 kg (159 lb)   LMP 12/06/2015   SpO2 99%   BMI 31.05 kg/m   Today's Vitals   05/23/16 2101 05/23/16 2110 05/23/16 2130 05/23/16 2200  BP: (!) 143/86  (!) 142/79 129/80  Pulse: 98  (!) 101 (!) 109  Resp:      Temp:  99.3 F (37.4 C)    TempSrc:  Axillary    SpO2:      Weight:      Height:      PainSc:       Gen: NAD Lungs: CTAB CV: RRR w/o M/R/G Abdomen: gravid, soft, NTND FHT: BL 150s w/ min variability, occ accels, mild lates  UC:   irregular, every 2-4 minutes SVE:   Dilation: 5.5 Effacement (%): 60 Station: -2, -1 Exam by:: S.dixon Rn@ 2137 Ext: 2+/2+ brachial, no clonus Pitocin max 9 mU/min, off at 2133  Predicted chance of VBAC = 67.9%    Assessment:  IUP at term IOL d/t GHTN; BPs within parameters H/O c-s x 2 Progressive labor Cat 2 FHRT, min variability GBS positive PCN allergy Gestational Thrombocytopenia  Plan: Pitocin off x 30 min - restart at 4 mU/min provided tracing Cat 1 500 ml bolus now Position change Peanut placed Monitor closely Will update Dr. Elmer Rampole  [name], [facility] CNM 05/23/2016, 10:00 PM

## 2016-05-23 NOTE — Progress Notes (Signed)
Wanda GreenlandJenneh B Shadoan MRN: [id number]  Subjective: Care assumed of 36y.o. G4P3003 at 36.1wks who presents for IOL s/t GHTN. Patient with history of C/S x 2 with successful VBAC between.  In room to meet acquaintance and discuss POC.  Patient denies HA, Visual disturbances, SOB, and reports ctx pain.  Patient does not desire epidural for pain mgmt.  Objective: BP (!) 147/93   Pulse 96   Temp 98.3 F (36.8 C) (Oral)   Resp 18   Ht 5' (1.524 m)   Wt 72.1 kg (159 lb)   BMI 31.05 kg/m  No intake/output data recorded. No intake/output data recorded.  Fetal Monitoring: FHT: 150 bpm, Min Var, -Decels, -Accels UC: Irregular, MVUs 50mmHg   Vaginal Exam: SVE:  Deferred Membranes:AROM x 7hrs Internal Monitors: IUPC in place  Augmentation/Induction: Pitocin:133mUn/min  Cytotec: None S/P Foley Bulb  Assessment:  IUP at 36.1wks Cat I FT  TOLAC GHTN Afebrile GBS Positive with PCN Allergy  Plan: -Continue pitocin titration at 231mUn/min Q 30min as appropriate -Will assess cervix once adequate contractions reached or upon patient request -Okay for IV pain medication as desired -Continue other mgmt as ordered   Valma CavaJessica L Stesha Neyens,MSN, CNM 05/23/2016, 7:45 AM

## 2016-05-23 NOTE — Progress Notes (Signed)
[name] is a 36 y.o. G5P1013 at 2473w1d   Subjective: Called by Wanda HeckJessica [name], CNM secondary to ?bloody show after a check that was questionable.  Pt says ctxs feel 10/10 but no area of localized pain.  Objective: BP (!) 178/79 Comment: bp cuff adjusted, retake in progress  Pulse 95   Temp 98.9 F (37.2 C) (Oral)   Resp 17   Ht 5' (1.524 m)   Wt 159 lb (72.1 kg)   LMP 12/06/2015   SpO2 100%   BMI 31.05 kg/m  No intake/output data recorded. No intake/output data recorded.  FHT:  FHR: 140s-150s bpm, variability: min-mod ,  accelerations:  Present,  decelerations:  Absent UC:   regular, every 2-3 minutes SVE:   Dilation: 4.5 Effacement (%): 50 Station: -3 Exam by:: dr [name]  Labs: Lab Results  Component Value Date   WBC 6.8 05/22/2016   HGB 11.0 (L) 05/22/2016   HCT 33.0 (L) 05/22/2016   MCV 77.1 (L) 05/22/2016   PLT 143 (L) 05/22/2016    Assessment / Plan: In[facility],  progressing well on pitocin currently in early phase of labor  Labor: titrate pitocin per protocol Preeclampsia:  no signs or symptoms of toxicity.  BP elevated right now during contraction, will get epidural and reevaluate.  Labs now. Fetal Wellbeing:  Category I Pain Control:  Nitrous Oxide and pt agreeable to proceed with epidural now I/D:  GBS + on Vanc Anticipated MOD:  NSVD  [name] Y 05/23/2016, 6:11 PM

## 2016-05-23 NOTE — Anesthesia Preprocedure Evaluation (Addendum)
Anesthesia Evaluation  Patient identified by MRN, date of birth, ID band Patient awake    Reviewed: Allergy & Precautions, H&P , Patient's Chart, lab work & pertinent test results  Airway Mallampati: II  TM Distance: >3 FB Neck ROM: full    Dental  (+) Teeth Intact   Pulmonary    breath sounds clear to auscultation       Cardiovascular hypertension,  Rhythm:regular Rate:Normal     Neuro/Psych    GI/Hepatic   Endo/Other    Renal/GU      Musculoskeletal   Abdominal   Peds  Hematology   Anesthesia Other Findings  PIH TOLAC     Reproductive/Obstetrics (+) Pregnancy                             Anesthesia Physical Anesthesia Plan  ASA: II  Anesthesia Plan: Epidural   Post-op Pain Management:    Induction:   Airway Management Planned:   Additional Equipment:   Intra-op Plan:   Post-operative Plan:   Informed Consent: I have reviewed the patients History and Physical, chart, labs and discussed the procedure including the risks, benefits and alternatives for the proposed anesthesia with the patient or authorized representative who has indicated his/her understanding and acceptance.   Dental Advisory Given  Plan Discussed with: CRNA and Surgeon  Anesthesia Plan Comments: (Labs checked- platelets confirmed with RN in room. Fetal heart tracing, per RN, reported to be stable enough for sitting procedure. Discussed epidural, and patient consents to the procedure:  included risk of possible headache,backache, failed block, allergic reaction, and nerve injury. This patient was asked if she had any questions or concerns before the procedure started. Pt coming for C/S with labor epidural intact and working.)       Anesthesia Quick Evaluation

## 2016-05-23 NOTE — Anesthesia Procedure Notes (Signed)
Epidural Patient location during procedure: OB Start time: 05/23/2016 6:55 PM End time: 05/23/2016 7:06 PM  Staffing Anesthesiologist: Cristela BlueJACKSON, Justin Buechner  Preanesthetic Checklist Completed: patient identified, site marked, surgical consent, pre-op evaluation, timeout performed, IV checked, risks and benefits discussed and monitors and equipment checked  Epidural Patient position: sitting Prep: site prepped and draped and DuraPrep Patient monitoring: continuous pulse ox and blood pressure Approach: midline Location: L3-L4 Injection technique: LOR air  Needle:  Needle type: Tuohy  Needle gauge: 17 G Needle length: 9 cm and 9 Needle insertion depth: 5 cm cm Catheter type: closed end flexible Catheter size: 19 Gauge Catheter at skin depth: 11 cm Test dose: negative  Assessment Events: blood not aspirated, injection not painful, no injection resistance, negative IV test and no paresthesia  Additional Notes Dosing of Epidural:  1st dose, through catheter ............................................Marland Kitchen.  Xylocaine 40 mg  2nd dose, through catheter, after waiting 3 minutes........Marland Kitchen.Xylocaine 60 mg    As each dose occurred, patient was free of IV sx; and patient exhibited no evidence of SA injection.  Patient is more comfortable after epidural dosed. Please see RN's note for documentation of vital signs,and FHR which are stable.  Patient reminded not to try to ambulate with numb legs, and that an RN must be present when she attempts to get up.

## 2016-05-24 ENCOUNTER — Encounter (HOSPITAL_COMMUNITY)
Admission: AD | Disposition: A | Discharge status: Home / Self Care | Payer: Self-pay | Source: Ambulatory Visit | Attending: Obstetrics and Gynecology

## 2016-05-24 ENCOUNTER — Encounter (HOSPITAL_COMMUNITY): Payer: Self-pay | Admitting: *Deleted

## 2016-05-24 LAB — CBC WITH DIFFERENTIAL/PLATELET
Basophils Absolute: 0 10*3/uL (ref 0.0–0.1)
Basophils Relative: 0 %
Eosinophils Absolute: 0 10*3/uL (ref 0.0–0.7)
Eosinophils Relative: 0 %
HCT: 35.5 % — ABNORMAL LOW (ref 36.0–46.0)
Hemoglobin: 11.8 g/dL — ABNORMAL LOW (ref 12.0–15.0)
Lymphocytes Relative: 11 %
Lymphs Abs: 2.1 10*3/uL (ref 0.7–4.0)
MCH: 25.7 pg — ABNORMAL LOW (ref 26.0–34.0)
MCHC: 33.2 g/dL (ref 30.0–36.0)
MCV: 77.2 fL — ABNORMAL LOW (ref 78.0–100.0)
Monocytes Absolute: 1.5 10*3/uL — ABNORMAL HIGH (ref 0.1–1.0)
Monocytes Relative: 8 %
Neutro Abs: 15.6 10*3/uL — ABNORMAL HIGH (ref 1.7–7.7)
Neutrophils Relative %: 81 %
Other: 0 %
Platelets: 135 10*3/uL — ABNORMAL LOW (ref 150–400)
RBC: 4.6 MIL/uL (ref 3.87–5.11)
RDW: 17.4 % — ABNORMAL HIGH (ref 11.5–15.5)
WBC: 19.2 10*3/uL — ABNORMAL HIGH (ref 4.0–10.5)

## 2016-05-24 LAB — COMPREHENSIVE METABOLIC PANEL
ALT: 38 U/L (ref 14–54)
ALT: 31 U/L (ref 14–54)
AST: 30 U/L (ref 15–41)
AST: 31 U/L (ref 15–41)
Albumin: 2.9 g/dL — ABNORMAL LOW (ref 3.5–5.0)
Albumin: 2.1 g/dL — ABNORMAL LOW (ref 3.5–5.0)
Alkaline Phosphatase: 228 U/L — ABNORMAL HIGH (ref 38–126)
Alkaline Phosphatase: 171 U/L — ABNORMAL HIGH (ref 38–126)
Anion gap: 10 (ref 5–15)
Anion gap: 7 (ref 5–15)
BUN: 7 mg/dL (ref 6–20)
BUN: 7 mg/dL (ref 6–20)
CO2: 20 mmol/L — ABNORMAL LOW (ref 22–32)
CO2: 21 mmol/L — ABNORMAL LOW (ref 22–32)
Calcium: 8.5 mg/dL — ABNORMAL LOW (ref 8.9–10.3)
Calcium: 8 mg/dL — ABNORMAL LOW (ref 8.9–10.3)
Chloride: 100 mmol/L — ABNORMAL LOW (ref 101–111)
Chloride: 102 mmol/L (ref 101–111)
Creatinine, Ser: 1.05 mg/dL — ABNORMAL HIGH (ref 0.44–1.00)
Creatinine, Ser: 1 mg/dL (ref 0.44–1.00)
GFR calc Af Amer: >60 mL/min (ref >60)
GFR calc Af Amer: >60 mL/min (ref >60)
GFR calc non Af Amer: >60 mL/min (ref >60)
GFR calc non Af Amer: >60 mL/min (ref >60)
Glucose, Bld: 132 mg/dL — ABNORMAL HIGH (ref 65–99)
Glucose, Bld: 131 mg/dL — ABNORMAL HIGH (ref 65–99)
Potassium: 3.6 mmol/L (ref 3.5–5.1)
Potassium: 3.5 mmol/L (ref 3.5–5.1)
Sodium: 130 mmol/L — ABNORMAL LOW (ref 135–145)
Sodium: 130 mmol/L — ABNORMAL LOW (ref 135–145)
Total Bilirubin: 0.8 mg/dL (ref 0.3–1.2)
Total Bilirubin: 0.5 mg/dL (ref 0.3–1.2)
Total Protein: 6.2 g/dL — ABNORMAL LOW (ref 6.5–8.1)
Total Protein: 5 g/dL — ABNORMAL LOW (ref 6.5–8.1)

## 2016-05-24 LAB — CBC
HCT: 28 % — ABNORMAL LOW (ref 36.0–46.0)
Hemoglobin: 9.4 g/dL — ABNORMAL LOW (ref 12.0–15.0)
MCH: 25.8 pg — AB (ref 26.0–34.0)
MCHC: 33.6 g/dL (ref 30.0–36.0)
MCV: 76.9 fL — ABNORMAL LOW (ref 78.0–100.0)
Platelets: 109 10*3/uL — ABNORMAL LOW (ref 150–400)
RBC: 3.64 MIL/uL — ABNORMAL LOW (ref 3.87–5.11)
RDW: 17.3 % — ABNORMAL HIGH (ref 11.5–15.5)
WBC: 15.4 10*3/uL — ABNORMAL HIGH (ref 4.0–10.5)

## 2016-05-24 LAB — PROTEIN / CREATININE RATIO, URINE
Creatinine, Urine: 139 mg/dL
Protein Creatinine Ratio: 2.82 mg/mg{Cre} — ABNORMAL HIGH (ref 0.00–0.15)
Total Protein, Urine: 392 mg/dL

## 2016-05-24 SURGERY — Surgical Case
Anesthesia: Epidural

## 2016-05-24 MED ORDER — CARBOPROST TROMETHAMINE 250 MCG/ML IM SOLN
INTRAMUSCULAR | Status: DC | PRN
  Administered 2016-05-24: 250 ug via INTRAMUSCULAR

## 2016-05-24 MED ORDER — KETOROLAC TROMETHAMINE 30 MG/ML IJ SOLN
INTRAMUSCULAR | Status: AC
  Filled 2016-05-24: qty 1

## 2016-05-24 MED ORDER — OXYCODONE-ACETAMINOPHEN 5-325 MG PO TABS
2.0000 | ORAL_TABLET | ORAL | Status: DC | PRN
Start: 2016-05-24 — End: 2016-05-27
  Administered 2016-05-27: 2 via ORAL
  Filled 2016-05-24 (×2): qty 2

## 2016-05-24 MED ORDER — DEXAMETHASONE SODIUM PHOSPHATE 10 MG/ML IJ SOLN
INTRAMUSCULAR | Status: AC
  Filled 2016-05-24: qty 1

## 2016-05-24 MED ORDER — ACETAMINOPHEN 500 MG PO TABS
1000.0000 mg | ORAL_TABLET | Freq: Four times a day (QID) | ORAL | Status: AC
  Administered 2016-05-24 – 2016-05-25 (×3): 1000 mg via ORAL
  Filled 2016-05-24 (×2): qty 2

## 2016-05-24 MED ORDER — TETANUS-DIPHTH-ACELL PERTUSSIS 5-2.5-18.5 LF-MCG/0.5 IM SUSP: 0.5000 mL | Freq: Once | INTRAMUSCULAR | Status: DC

## 2016-05-24 MED ORDER — NALOXONE HCL 2 MG/2ML IJ SOSY
1.0000 ug/kg/h | PREFILLED_SYRINGE | INTRAVENOUS | Status: DC | PRN
  Filled 2016-05-24: qty 2

## 2016-05-24 MED ORDER — PRENATAL MULTIVITAMIN CH
1.0000 | ORAL_TABLET | Freq: Every day | ORAL | Status: DC
  Administered 2016-05-25 – 2016-05-27 (×3): 1 via ORAL
  Filled 2016-05-24 (×3): qty 1

## 2016-05-24 MED ORDER — DIBUCAINE 1 % RE OINT: 1.0000 "application " | TOPICAL_OINTMENT | RECTAL | Status: DC | PRN

## 2016-05-24 MED ORDER — KETOROLAC TROMETHAMINE 30 MG/ML IJ SOLN: 30.0000 mg | Freq: Four times a day (QID) | INTRAMUSCULAR | Status: DC | PRN

## 2016-05-24 MED ORDER — ACETAMINOPHEN 500 MG PO TABS
ORAL_TABLET | ORAL | Status: AC
  Filled 2016-05-24: qty 2

## 2016-05-24 MED ORDER — LACTATED RINGERS IV SOLN
INTRAVENOUS | Status: DC | PRN
  Administered 2016-05-24 (×2): via INTRAVENOUS

## 2016-05-24 MED ORDER — ACETAMINOPHEN 500 MG PO TABS
ORAL_TABLET | ORAL | Status: AC
  Administered 2016-05-24: 1000 mg via ORAL
  Filled 2016-05-24: qty 2

## 2016-05-24 MED ORDER — KETOROLAC TROMETHAMINE 30 MG/ML IJ SOLN
30.0000 mg | Freq: Four times a day (QID) | INTRAMUSCULAR | Status: DC | PRN
  Administered 2016-05-24: 30 mg via INTRAMUSCULAR

## 2016-05-24 MED ORDER — OXYTOCIN 10 UNIT/ML IJ SOLN
INTRAMUSCULAR | Status: AC
  Filled 2016-05-24: qty 4

## 2016-05-24 MED ORDER — ACETAMINOPHEN 500 MG PO TABS
1000.0000 mg | ORAL_TABLET | Freq: Once | ORAL | Status: AC
  Administered 2016-05-24: 1000 mg via ORAL

## 2016-05-24 MED ORDER — OXYTOCIN 40 UNITS IN LACTATED RINGERS INFUSION - SIMPLE MED: 2.5000 [IU]/h | INTRAVENOUS | Status: AC

## 2016-05-24 MED ORDER — SENNOSIDES-DOCUSATE SODIUM 8.6-50 MG PO TABS
2.0000 | ORAL_TABLET | ORAL | Status: DC
  Administered 2016-05-24 – 2016-05-26 (×3): 2 via ORAL
  Filled 2016-05-24 (×3): qty 2

## 2016-05-24 MED ORDER — NALBUPHINE HCL 10 MG/ML IJ SOLN: 5.0000 mg | Freq: Once | INTRAMUSCULAR | Status: DC | PRN

## 2016-05-24 MED ORDER — GENTAMICIN SULFATE 40 MG/ML IJ SOLN
Freq: Three times a day (TID) | INTRAVENOUS | Status: DC
  Administered 2016-05-24 – 2016-05-25 (×5): via INTRAVENOUS
  Filled 2016-05-24 (×6): qty 3.5

## 2016-05-24 MED ORDER — COCONUT OIL OIL
1.0000 "application " | TOPICAL_OIL | Status: DC | PRN
  Administered 2016-05-26 (×2): 1 via TOPICAL
  Filled 2016-05-24 (×2): qty 120

## 2016-05-24 MED ORDER — LACTATED RINGERS IV SOLN
INTRAVENOUS | Status: DC
  Administered 2016-05-24 – 2016-05-25 (×2): via INTRAVENOUS

## 2016-05-24 MED ORDER — SIMETHICONE 80 MG PO CHEW
80.0000 mg | CHEWABLE_TABLET | ORAL | Status: DC
  Administered 2016-05-24 – 2016-05-26 (×3): 80 mg via ORAL
  Filled 2016-05-24 (×3): qty 1

## 2016-05-24 MED ORDER — OXYCODONE-ACETAMINOPHEN 5-325 MG PO TABS
1.0000 | ORAL_TABLET | ORAL | Status: DC | PRN
Start: 2016-05-24 — End: 2016-05-27
  Administered 2016-05-26 – 2016-05-27 (×5): 1 via ORAL
  Filled 2016-05-24 (×4): qty 1

## 2016-05-24 MED ORDER — NALBUPHINE HCL 10 MG/ML IJ SOLN: 5.0000 mg | INTRAMUSCULAR | Status: DC | PRN

## 2016-05-24 MED ORDER — SCOPOLAMINE 1 MG/3DAYS TD PT72
MEDICATED_PATCH | TRANSDERMAL | Status: DC | PRN
  Administered 2016-05-24: 1 via TRANSDERMAL

## 2016-05-24 MED ORDER — DIPHENHYDRAMINE HCL 50 MG/ML IJ SOLN: 12.5000 mg | INTRAMUSCULAR | Status: DC | PRN

## 2016-05-24 MED ORDER — SODIUM BICARBONATE 8.4 % IV SOLN
INTRAVENOUS | Status: DC | PRN
  Administered 2016-05-24 (×2): 3 mL via EPIDURAL
  Administered 2016-05-24: 7 mL via EPIDURAL
  Administered 2016-05-24: 3 mL via EPIDURAL
  Administered 2016-05-24: 4 mL via EPIDURAL

## 2016-05-24 MED ORDER — HYDRALAZINE HCL 20 MG/ML IJ SOLN: 10.0000 mg | Freq: Once | INTRAMUSCULAR | Status: DC | PRN

## 2016-05-24 MED ORDER — SCOPOLAMINE 1 MG/3DAYS TD PT72: 1.0000 | MEDICATED_PATCH | Freq: Once | TRANSDERMAL | Status: DC

## 2016-05-24 MED ORDER — MORPHINE SULFATE (PF) 0.5 MG/ML IJ SOLN
INTRAMUSCULAR | Status: AC
  Filled 2016-05-24: qty 10

## 2016-05-24 MED ORDER — DIPHENOXYLATE-ATROPINE 2.5-0.025 MG PO TABS
2.0000 | ORAL_TABLET | Freq: Once | ORAL | Status: AC
  Administered 2016-05-24: 2 via ORAL
  Filled 2016-05-24 (×2): qty 2

## 2016-05-24 MED ORDER — IBUPROFEN 600 MG PO TABS: 600.0000 mg | ORAL_TABLET | Freq: Four times a day (QID) | ORAL | Status: DC | PRN

## 2016-05-24 MED ORDER — MENTHOL 3 MG MT LOZG: 1.0000 | LOZENGE | OROMUCOSAL | Status: DC | PRN

## 2016-05-24 MED ORDER — SIMETHICONE 80 MG PO CHEW
80.0000 mg | CHEWABLE_TABLET | Freq: Three times a day (TID) | ORAL | Status: DC
  Administered 2016-05-25 – 2016-05-27 (×7): 80 mg via ORAL
  Filled 2016-05-24 (×7): qty 1

## 2016-05-24 MED ORDER — DIPHENHYDRAMINE HCL 25 MG PO CAPS: 25.0000 mg | ORAL_CAPSULE | Freq: Four times a day (QID) | ORAL | Status: DC | PRN

## 2016-05-24 MED ORDER — ONDANSETRON HCL 4 MG/2ML IJ SOLN
4.0000 mg | Freq: Three times a day (TID) | INTRAMUSCULAR | Status: DC | PRN
  Administered 2016-05-24: 4 mg via INTRAVENOUS
  Filled 2016-05-24: qty 2

## 2016-05-24 MED ORDER — LABETALOL HCL 5 MG/ML IV SOLN: 20.0000 mg | INTRAVENOUS | Status: DC | PRN

## 2016-05-24 MED ORDER — DIPHENHYDRAMINE HCL 25 MG PO CAPS
25.0000 mg | ORAL_CAPSULE | ORAL | Status: DC | PRN
  Filled 2016-05-24: qty 1

## 2016-05-24 MED ORDER — SCOPOLAMINE 1 MG/3DAYS TD PT72
MEDICATED_PATCH | TRANSDERMAL | Status: AC
  Filled 2016-05-24: qty 1

## 2016-05-24 MED ORDER — CARBOPROST TROMETHAMINE 250 MCG/ML IM SOLN
INTRAMUSCULAR | Status: AC
  Filled 2016-05-24: qty 1

## 2016-05-24 MED ORDER — FENTANYL CITRATE (PF) 100 MCG/2ML IJ SOLN
INTRAMUSCULAR | Status: AC
  Filled 2016-05-24: qty 2

## 2016-05-24 MED ORDER — MAGNESIUM SULFATE 50 % IJ SOLN
2.0000 g/h | INTRAVENOUS | Status: DC
  Administered 2016-05-25: 2 g/h via INTRAVENOUS
  Filled 2016-05-24 (×2): qty 80

## 2016-05-24 MED ORDER — SIMETHICONE 80 MG PO CHEW: 80.0000 mg | CHEWABLE_TABLET | ORAL | Status: DC | PRN

## 2016-05-24 MED ORDER — SODIUM CHLORIDE 0.9% FLUSH: 3.0000 mL | INTRAVENOUS | Status: DC | PRN

## 2016-05-24 MED ORDER — FERROUS SULFATE 325 (65 FE) MG PO TABS
325.0000 mg | ORAL_TABLET | Freq: Two times a day (BID) | ORAL | Status: DC
  Administered 2016-05-25 – 2016-05-27 (×5): 325 mg via ORAL
  Filled 2016-05-24 (×5): qty 1

## 2016-05-24 MED ORDER — FENTANYL CITRATE (PF) 100 MCG/2ML IJ SOLN
INTRAMUSCULAR | Status: DC | PRN
  Administered 2016-05-24 (×2): 50 ug via INTRAVENOUS

## 2016-05-24 MED ORDER — IBUPROFEN 600 MG PO TABS
600.0000 mg | ORAL_TABLET | Freq: Four times a day (QID) | ORAL | Status: DC
  Administered 2016-05-24 – 2016-05-27 (×11): 600 mg via ORAL
  Filled 2016-05-24 (×11): qty 1

## 2016-05-24 MED ORDER — OXYTOCIN 10 UNIT/ML IJ SOLN
INTRAVENOUS | Status: DC | PRN
  Administered 2016-05-24: 40 [IU] via INTRAVENOUS

## 2016-05-24 MED ORDER — PRENATAL MULTIVITAMIN CH: 1.0000 | ORAL_TABLET | Freq: Every day | ORAL | Status: DC

## 2016-05-24 MED ORDER — NALOXONE HCL 0.4 MG/ML IJ SOLN: 0.4000 mg | INTRAMUSCULAR | Status: DC | PRN

## 2016-05-24 MED ORDER — ACETAMINOPHEN 325 MG PO TABS
650.0000 mg | ORAL_TABLET | ORAL | Status: DC | PRN
  Administered 2016-05-27: 650 mg via ORAL

## 2016-05-24 MED ORDER — MORPHINE SULFATE (PF) 0.5 MG/ML IJ SOLN
INTRAMUSCULAR | Status: DC | PRN
  Administered 2016-05-24: 4 mg via EPIDURAL
  Administered 2016-05-24 (×2): .5 mg via INTRAVENOUS

## 2016-05-24 MED ORDER — ZOLPIDEM TARTRATE 5 MG PO TABS: 5.0000 mg | ORAL_TABLET | Freq: Every evening | ORAL | Status: DC | PRN

## 2016-05-24 MED ORDER — HYDROMORPHONE HCL 1 MG/ML IJ SOLN: 0.2500 mg | INTRAMUSCULAR | Status: DC | PRN

## 2016-05-24 MED ORDER — ONDANSETRON HCL 4 MG/2ML IJ SOLN
INTRAMUSCULAR | Status: DC | PRN
  Administered 2016-05-24: 4 mg via INTRAVENOUS

## 2016-05-24 MED ORDER — KETOROLAC TROMETHAMINE 30 MG/ML IJ SOLN: 30.0000 mg | Freq: Once | INTRAMUSCULAR | Status: DC

## 2016-05-24 MED ORDER — MEPERIDINE HCL 25 MG/ML IJ SOLN: 6.2500 mg | INTRAMUSCULAR | Status: DC | PRN

## 2016-05-24 MED ORDER — PROMETHAZINE HCL 25 MG/ML IJ SOLN: 6.2500 mg | INTRAMUSCULAR | Status: DC | PRN

## 2016-05-24 MED ORDER — WITCH HAZEL-GLYCERIN EX PADS: 1.0000 "application " | MEDICATED_PAD | CUTANEOUS | Status: DC | PRN

## 2016-05-24 MED ORDER — DEXAMETHASONE SODIUM PHOSPHATE 10 MG/ML IJ SOLN
INTRAMUSCULAR | Status: DC | PRN
  Administered 2016-05-24: 10 mg via INTRAVENOUS

## 2016-05-24 MED ORDER — SODIUM CHLORIDE 0.9 % IR SOLN
Status: DC | PRN
  Administered 2016-05-24: 1

## 2016-05-24 MED ORDER — LACTATED RINGERS IV SOLN
INTRAVENOUS | Status: DC | PRN
  Administered 2016-05-24: 13:00:00 via INTRAVENOUS

## 2016-05-24 MED ORDER — MAGNESIUM SULFATE BOLUS VIA INFUSION
4.0000 g | Freq: Once | INTRAVENOUS | Status: AC
  Administered 2016-05-24: 4 g via INTRAVENOUS
  Filled 2016-05-24: qty 500

## 2016-05-24 MED ORDER — ONDANSETRON HCL 4 MG/2ML IJ SOLN
INTRAMUSCULAR | Status: AC
  Filled 2016-05-24: qty 2

## 2016-05-24 SURGICAL SUPPLY — 38 items
BARRIER ADHS 3X4 INTERCEED (GAUZE/BANDAGES/DRESSINGS) ×2 IMPLANT
BENZOIN TINCTURE PRP APPL 2/3 (GAUZE/BANDAGES/DRESSINGS) ×2 IMPLANT
CHLORAPREP W/TINT 26ML (MISCELLANEOUS) ×2 IMPLANT
CLAMP CORD UMBIL (MISCELLANEOUS) IMPLANT
CLOSURE STERI STRIP 1/2 X4 (GAUZE/BANDAGES/DRESSINGS) ×2 IMPLANT
CLOTH BEACON ORANGE TIMEOUT ST (SAFETY) ×2 IMPLANT
CONTAINER PREFILL 10% NBF 15ML (MISCELLANEOUS) IMPLANT
DRSG OPSITE POSTOP 4X10 (GAUZE/BANDAGES/DRESSINGS) ×2 IMPLANT
ELECT REM PT RETURN 9FT ADLT (ELECTROSURGICAL) ×2
ELECTRODE REM PT RTRN 9FT ADLT (ELECTROSURGICAL) ×1 IMPLANT
EXTRACTOR VACUUM KIWI (MISCELLANEOUS) ×2 IMPLANT
GLOVE BIOGEL M 6.5 STRL (GLOVE) ×4 IMPLANT
GLOVE BIOGEL PI IND STRL 6.5 (GLOVE) ×1 IMPLANT
GLOVE BIOGEL PI IND STRL 7.0 (GLOVE) ×1 IMPLANT
GLOVE BIOGEL PI INDICATOR 6.5 (GLOVE) ×1
GLOVE BIOGEL PI INDICATOR 7.0 (GLOVE) ×1
GOWN STRL REUS W/TWL LRG LVL3 (GOWN DISPOSABLE) ×6 IMPLANT
KIT ABG SYR 3ML LUER SLIP (SYRINGE) IMPLANT
NEEDLE HYPO 25X5/8 SAFETYGLIDE (NEEDLE) IMPLANT
NS IRRIG 1000ML POUR BTL (IV SOLUTION) ×2 IMPLANT
PACK C SECTION WH (CUSTOM PROCEDURE TRAY) ×2 IMPLANT
PAD ABD 7.5X8 STRL (GAUZE/BANDAGES/DRESSINGS) ×4 IMPLANT
PAD OB MATERNITY 4.3X12.25 (PERSONAL CARE ITEMS) ×2 IMPLANT
PENCIL SMOKE EVAC W/HOLSTER (ELECTROSURGICAL) ×2 IMPLANT
RTRCTR C-SECT PINK 25CM LRG (MISCELLANEOUS) IMPLANT
SPONGE LAP 18X18 X RAY DECT (DISPOSABLE) ×8 IMPLANT
STRIP CLOSURE SKIN 1/2X4 (GAUZE/BANDAGES/DRESSINGS) ×2 IMPLANT
SUT PDS AB 0 CT1 27 (SUTURE) ×4 IMPLANT
SUT PLAIN 0 NONE (SUTURE) IMPLANT
SUT VIC AB 0 CTX 36 (SUTURE) ×9
SUT VIC AB 0 CTX36XBRD ANBCTRL (SUTURE) ×9 IMPLANT
SUT VIC AB 2-0 CT1 27 (SUTURE) ×1
SUT VIC AB 2-0 CT1 TAPERPNT 27 (SUTURE) ×1 IMPLANT
SUT VIC AB 3-0 SH 27 (SUTURE)
SUT VIC AB 3-0 SH 27X BRD (SUTURE) IMPLANT
SUT VIC AB 4-0 KS 27 (SUTURE) ×4 IMPLANT
TOWEL OR 17X24 6PK STRL BLUE (TOWEL DISPOSABLE) ×2 IMPLANT
TRAY FOLEY CATH SILVER 14FR (SET/KITS/TRAYS/PACK) ×2 IMPLANT

## 2016-05-24 NOTE — Progress Notes (Signed)
Wanda GreenlandJenneh B [name] is a 36 y.o. G5P1013 at 7039w2dadmitted for induction of labor due to gestional hypertension.  Subjective:  Comfortable with  feeling pressure with epidural Contractions every 3-4 minutes, lasting 60 seconds, intensity 5/10.Cervix has not changed. MVU's have decreased with pitocin increase which is now on 2315mu/min. Continues with Category 2 strip. I recommended C/S due to no cervical change and length of rupture, along with maternal fever and fht tracing. Pt states she does not want a C/S. She is discussing with family members and RN will let me know.    Objective: BP (!) 147/99   Pulse (!) 101   Temp 99 F (37.2 C) (Oral)   Resp 18   Ht 5' (1.524 m)   Wt 159 lb (72.1 kg)   LMP 12/06/2015   SpO2 100%   BMI 31.05 kg/m  I/O last 3 completed shifts: In: -  Out: 675 [Urine:675] No intake/output data recorded.  FHT:  FHR: Category 2 with occasional variable decels.  bpm, variability MVU: 170 SVE:   Dilation: 8 Effacement (%): 70, 80 Station: 0 Exam by:: [employer] P[employer] FinancialLori Clemmons, CNM   Labs: Lab Results  Component Value Date   WBC 12.7 (H) 05/23/2016   HGB 12.0 05/23/2016   HCT 36.3 05/23/2016   MCV 77.7 (L) 05/23/2016   PLT 136 (L) 05/23/2016    Assessment / Plan: Protracted active phase  Previous C/S x 2 Dr Richardson Doppole in house Fetal Wellbeing: Cat 2;  Anticipated MOD:  NSVD  Lori A Clemmons CNM 05/24/2016, 10:28 AM

## 2016-05-24 NOTE — Progress Notes (Signed)
   Subjective:  patient reports headache and dizziness .   Objective: BP (!) 148/116   Pulse (!) 113   Temp 99 F (37.2 C) (Oral)   Resp 18   Ht 5' (1.524 m)   Wt 72.1 kg (159 lb)   LMP 12/06/2015   SpO2 100%   BMI 31.05 kg/m  I/O last 3 completed shifts: In: -  Out: 675 [Urine:675] Total I/O In: -  Out: 200 [Urine:200]  FHT:  FHR: 140 bpm, variability: minimal ,  accelerations:  Abscent,  decelerations:  Present variable  UC:   regular, every 5 minutes SVE:   Dilation: 7 (7-8) Effacement (%): 70, 80 Station: 0 Exam by:: Dr Richardson Doppole  Labs: Lab Results  Component Value Date   WBC 12.7 (H) 05/23/2016   HGB 12.0 05/23/2016   HCT 36.3 05/23/2016   MCV 77.7 (L) 05/23/2016   PLT 136 (L) 05/23/2016    Assessment / Plan: 39 wks and 2 days admitted for induction due to gestational hypertension.  Severe range pressures will repeat PIH labs  Failure to progress despite increase in pitocin and category 2 tracing. Recommend repeat cesarean section. R/b/a of cesarean section discussed with the patient including but not limited to infection / bleeding damage to bowel bladder baby with the need for further surgery . R/o transfusion discussed HIV/ Hep B&C . Pt voiced understanding and desires to proceed.  Chorioamnionitis - continue vanc/ gent / clinda for 24 hours post delivery  Joffre Lucks J. 05/24/2016, 11:19 AM

## 2016-05-24 NOTE — Progress Notes (Addendum)
  Subjective: States ready to be done. Feeling a lot of pressure in her bottom. Still desires to proceed w/ VBAC. Denies h/a, visual changes, epigastric pain, difficulty breathing or pain at incision site.   Objective: BP 137/90   Pulse (!) 112   Temp (!) 101.3 F (38.5 C) (Axillary)   Resp 17   Ht 5' (1.524 m)   Wt 72.1 kg (159 lb)   LMP 12/06/2015   SpO2 99%   BMI 31.05 kg/m  No intake/output data recorded. Total I/O In: -  Out: 675 [Urine:675]  Today's Vitals   05/24/16 0635 05/24/16 0640 05/24/16 0645 05/24/16 0650  BP:      Pulse: (!) 118 (!) 115 (!) 114 (!) 112  Resp:      Temp:      TempSrc:      SpO2: 100% 99% 99% 99%  Weight:      Height:      PainSc:      Has not required IV antihypertensives Maternal pulse 90-125 bpm  T max 101.3  FHT: BL 160s w/ min-mod variability, occ accels, earlys, variables UC:   irregular, every 3-4 minutes SVE:   Dilation: 7 Effacement (%): 80 Station: -1, 0 Exam by:: S.dixon RN @ 06:15 - no blood on examining glove Pitocin at 11 mU/min  Assessment:  36 yo G4P3003 at term IOL due to GHTN Febrile Likely dehydrated Overall reassuring FHRT VBAC success rate = 67.9% GBS positive - adequately treated Gestational Thrombocytopenia  Plan: 1 gram of Tylenol now 500 ml bolus now Continue Clinda and Gent Dr. Richardson Doppole updated  Mayford KnifeWILLIAMS, [facility] CNM 05/24/2016, 6:56 AM

## 2016-05-24 NOTE — Transfer of Care (Signed)
Immediate Anesthesia Transfer of Care Note  Patient: [name]  Procedure(s) Performed: Procedure(s): CESAREAN SECTION (N/A)  Patient Location: PACU  Anesthesia Type:Epidural  Level of Consciousness: awake, alert  and oriented  Airway & Oxygen Therapy: Patient Spontanous Breathing  Post-op Assessment: Report given to RN and Post -op Vital signs reviewed and stable  Post vital signs: Reviewed and stable  Last Vitals:  Vitals:   05/24/16 1231 05/24/16 1424  BP: (!) 143/94   Pulse: 99   Resp:    Temp:  36.9 C    Last Pain:  Vitals:   05/24/16 1150  TempSrc:   PainSc: 5          Complications: No apparent anesthesia complications

## 2016-05-24 NOTE — Lactation Note (Signed)
This note was copied from a baby's chart. Lactation Consultation Note  Patient Name: [name] Reason for consult: Initial assessment   Initial assessment with Exp BF mom of 9 hour old infant. Mom reports BF is going well. She denies nipple pain or tenderness. Enc mom to feed infant 8-12 x in 24 hours at first feeding cues. Mom without questions/concerns at this time.   BF Resources Handout and LC Brochure given, mom informed of IP/OP Services, BF Support Groups and LC phone #. Enc mom to call out to desk for feeding assistance as needed.    Maternal Data Formula Feeding for Exclusion: Yes Reason for exclusion: Mother's choice to formula and breast feed on admission Does the patient have breastfeeding experience prior to this delivery?: Yes  Feeding Feeding Type: Breast Fed Length of feed: 30 min  LATCH Score/Interventions Latch: Grasps breast easily, tongue down, lips flanged, rhythmical sucking.  Audible Swallowing: Spontaneous and intermittent  Type of Nipple: Everted at rest and after stimulation  Comfort (Breast/Nipple): Soft / non-tender     Hold (Positioning): Assistance needed to correctly position infant at breast and maintain latch. Intervention(s): Support Pillows;Position options  LATCH Score: 9  Lactation Tools Discussed/Used WIC Program: No   Consult Status Consult Status: Follow-up Date: 05/25/16 Follow-up type: In-patient    Silas FloodSharon S Amadu Schlageter [name], 10:14 PM

## 2016-05-24 NOTE — Op Note (Signed)
Cesarean Section Procedure Note  Indications: failure to progress: arrest of dilation  Pre-operative Diagnosis: 39 week 2 day pregnancy.  Post-operative Diagnosis: same  Surgeon: Jessee AversOLE,Rahn Lacuesta J.   Assistants: Illene BolusLori Clemmons CNM  Anesthesia: Spinal anesthesia  ASA Class: 2   Procedure Details   The patient was seen in the Holding Room. The risks, benefits, complications, treatment options, and expected outcomes were discussed with the patient.  The patient concurred with the proposed plan, giving informed consent.  The site of surgery properly noted/marked. The patient was taken to Operating Room # 1, identified as Maximino GreenlandJenneh B Reimann and the procedure verified as C-Section Delivery. A Time Out was held and the above information confirmed.  After induction of anesthesia, the patient was draped and prepped in the usual sterile manner. A Pfannenstiel incision was made and carried down through the subcutaneous tissue to the fascia. Fascial incision was made and extended transversely. The fascia was separated from the underlying rectus tissue superiorly and inferiorly. The peritoneum was identified and entered. Peritoneal incision was extended longitudinally. The utero-vesical peritoneal reflection was incised transversely and the bladder flap was bluntly freed from the lower uterine segment. A low transverse uterine incision was made. Delivered from cephalic presentation was a  Female with Apgar scores of 9 at one minute and 10 at five minutes. After the umbilical cord was clamped and cut cord blood was obtained for evaluation. The placenta was removed intact and appeared normal. The uterine outline, tubes and ovaries appeared normal. The uterine incision was closed with running locked sutures of 0 vicryl. . Hemostasis was observed. Lavage was carried out until clear. The fascia was then reapproximated with running sutures of 0 pds. The skin was reapproximated with 4-0 vicryl .  Instrument, sponge, and  needle counts were correct prior the abdominal closure and at the conclusion of the case.   Findings: Female infant in cephalic presentation Normal appearing fallopian tubes and ovaries   Estimated Blood Loss:  1100 mL         Drains: None         Total IV Fluids:  Per anesthesia ml         Specimens: Placenta and Disposition:  Sent to Pathology          Implants: None          Complications:  None; patient tolerated the procedure well.         Disposition: PACU - hemodynamically stable.         Condition: stable  Attending Attestation: I performed the procedure.

## 2016-05-24 NOTE — Progress Notes (Addendum)
S: No c/o.  O:  Today's Vitals   05/23/16 2305 05/23/16 2330 05/24/16 0000 05/24/16 0030  BP:  (!) 155/103 (!) 144/96 135/81  Pulse:  (!) 102 (!) 101 (!) 110  Resp:      Temp: (!) 100.8 F (38.2 C)     TempSrc: Axillary     SpO2:      Weight:      Height:      PainSc:       Pitocin at 6 mU/min Has not required IV anti-hypertensives  A: 36 yo G4P3 @ term admitted for GHTN H/O c-section x 2 w/ successful VBAC in between GBS positive PCN allergy Ruptured for > 24 hrs, now w/ fever Gestational Thrombocytopenia  P: Pharmacy consult for Gent and Clindamycin Monitor closely   [name], CNM 05/24/16, 1:10 AM

## 2016-05-24 NOTE — Anesthesia Postprocedure Evaluation (Signed)
Anesthesia Post Note  Patient: [name]  Procedure(s) Performed: Procedure(s) (LRB): CESAREAN SECTION (N/A)  Patient location during evaluation: PACU Anesthesia Type: Epidural Level of consciousness: awake Pain management: pain level controlled Vital Signs Assessment: post-procedure vital signs reviewed and stable Respiratory status: spontaneous breathing Cardiovascular status: stable Postop Assessment: no headache, no backache, epidural receding, patient able to bend at knees and no signs of nausea or vomiting Anesthetic complications: no        Last Vitals:  Vitals:   05/24/16 1545 05/24/16 1600  BP: 112/71 116/79  Pulse: 73 72  Resp: (!) 23 14  Temp: 36.6 C     Last Pain:  Vitals:   05/24/16 1545  TempSrc: Axillary  PainSc: 4    Pain Goal:                 Arin Peral JR,JOHN Virgilio Broadhead

## 2016-05-24 NOTE — Progress Notes (Signed)
ANTIBIOTIC CONSULT NOTE - INITIAL  Pharmacy Consult for Gentamicin Indication: maternal fever  Allergies  Allergen Reactions  . Penicillins Swelling and Rash    Amoxillin is OK. Has patient had a PCN reaction causing immediate rash, facial/tongue/throat swelling, SOB or lightheadedness with hypotension: Yes Has patient had a PCN reaction causing severe rash involving mucus membranes or skin necrosis: Yes Has patient had a PCN reaction that required hospitalization No Has patient had a PCN reaction occurring within the last 10 years: No If all of the above answers are "NO", then may proceed with Cephalosporin use.     Patient Measurements: Height: 5' (152.4 cm) Weight: 159 lb (72.1 kg) IBW/kg (Calculated) : 45.5 Adjusted Body Weight: 53.5  Vital Signs: Temp: 100.5 F (38.1 C) (01/06 0103) Temp Source: Axillary (01/06 0103) BP: 140/82 (01/06 0103) Pulse Rate: 115 (01/06 0103)  Labs:  Recent Labs  05/22/16 1207 05/22/16 1325 05/23/16 1811  WBC  --  6.8 12.7*  HGB  --  11.0* 12.0  PLT  --  143* 136*  LABCREA 22.00  --   --   CREATININE  --  0.51 0.53   No results for input(s): GENTTROUGH, GENTPEAK, GENTRANDOM in the last 72 hours.   Microbiology: No results found for this or any previous visit (from the past 720 hour(s)).  Medications:  Vancomycin 1000mg  Q12   Assessment: 36 y.o. female G5P1013 at 3236w2d admitted for IOL for gestational HTN. Now with maternal fever (38.2, 38.1) most recently. Starting gent/clinda Estimated Ke = 0.318 hr-1, Vd = 0.39 L/kg  Goal of Therapy:  Gentamicin peak 6-8 mg/L and Trough < 1 mg/L  Plan:  Gentamicin 140 mg IV every 8 hrs  Check Scr with next labs if gentamicin continued. Will check gentamicin levels if continued > 72hr or clinically indicated.  Sonnie Bias Scarlett 05/24/2016,1:24 AM

## 2016-05-25 DIAGNOSIS — O09529 Supervision of elderly multigravida, unspecified trimester: Secondary | ICD-10-CM

## 2016-05-25 DIAGNOSIS — D696 Thrombocytopenia, unspecified: Secondary | ICD-10-CM

## 2016-05-25 DIAGNOSIS — O99119 Other diseases of the blood and blood-forming organs and certain disorders involving the immune mechanism complicating pregnancy, unspecified trimester: Secondary | ICD-10-CM

## 2016-05-25 LAB — COMPREHENSIVE METABOLIC PANEL
ALBUMIN: 2 g/dL — AB (ref 3.5–5.0)
ALK PHOS: 146 U/L — AB (ref 38–126)
ALT: 25 U/L (ref 14–54)
ANION GAP: 7 (ref 5–15)
AST: 25 U/L (ref 15–41)
BILIRUBIN TOTAL: 0.4 mg/dL (ref 0.3–1.2)
BUN: 8 mg/dL (ref 6–20)
CALCIUM: 7.8 mg/dL — AB (ref 8.9–10.3)
CO2: 23 mmol/L (ref 22–32)
CREATININE: 0.95 mg/dL (ref 0.44–1.00)
Chloride: 102 mmol/L (ref 101–111)
GFR calc Af Amer: >60 mL/min (ref >60)
GFR calc non Af Amer: >60 mL/min (ref >60)
GLUCOSE: 110 mg/dL — AB (ref 65–99)
Potassium: 3.6 mmol/L (ref 3.5–5.1)
SODIUM: 132 mmol/L — AB (ref 135–145)
TOTAL PROTEIN: 4.4 g/dL — AB (ref 6.5–8.1)

## 2016-05-25 LAB — CBC
HCT: 23.8 % — ABNORMAL LOW (ref 36.0–46.0)
Hemoglobin: 8.4 g/dL — ABNORMAL LOW (ref 12.0–15.0)
MCH: 26.4 pg (ref 26.0–34.0)
MCHC: 35.3 g/dL (ref 30.0–36.0)
MCV: 74.8 fL — ABNORMAL LOW (ref 78.0–100.0)
Platelets: 128 10*3/uL — ABNORMAL LOW (ref 150–400)
RBC: 3.18 MIL/uL — ABNORMAL LOW (ref 3.87–5.11)
RDW: 17.1 % — ABNORMAL HIGH (ref 11.5–15.5)
WBC: 20.6 10*3/uL — ABNORMAL HIGH (ref 4.0–10.5)

## 2016-05-25 NOTE — Progress Notes (Signed)
Subjective: Postpartum Day 1: Cesarean Delivery Patient reports that pain is well-managed.Lochia normal.  Ambulating, voiding, tolerating diet as ordered without difficulty. Normal flatus.  Absent bowel movement.  Objective: Vital signs in last 24 hours: Temp:  [97.4 F (36.3 C)-99 F (37.2 C)] 98 F (36.7 C) (01/07 0800) Pulse Rate:  [56-113] 65 (01/07 0800) Resp:  [13-30] 16 (01/07 0800) BP: (96-148)/(46-116) 108/55 (01/07 0800) SpO2:  [96 %-100 %] 100 % (01/07 0800)  Physical Exam:  General: alert Lochia: appropriate Uterine Fundus: firm and appropriately tender Incision: dressing dry and clean DVT Evaluation: No evidence of DVT seen on physical exam. Edema none   Recent Labs  05/24/16 1511 05/25/16 0522  HGB 9.4* 8.4*  HCT 28.0* 23.8*    Assessment/Plan: Status post Cesarean section. Doing well postoperatively.  Pressure dressing on, no drainage Question from pharmacy regarding continuing abx therapy. Redirected question to Dr. Richardson Doppole. Mag Sulfate will be dc'ed at 500pm ABX at 1300 Continue current care. Anticipate discharge tomorrow  [name] CNM 05/25/2016, 10:07 AM

## 2016-05-25 NOTE — Anesthesia Postprocedure Evaluation (Addendum)
Anesthesia Post Note  Patient: [name]  Procedure(s) Performed: Procedure(s) (LRB): CESAREAN SECTION (N/A)  Patient location during evaluation: Mother Baby Anesthesia Type: Epidural Level of consciousness: awake and alert and oriented Pain management: pain level controlled Vital Signs Assessment: post-procedure vital signs reviewed and stable Respiratory status: spontaneous breathing and nonlabored ventilation Cardiovascular status: stable Postop Assessment: no headache, no backache, patient able to bend at knees, no signs of nausea or vomiting and adequate PO intake Anesthetic complications: no        Last Vitals:  Vitals:   05/25/16 0102 05/25/16 0600  BP: 122/68 (!) 96/46  Pulse: (!) 56 66  Resp: 16 15  Temp: 36.7 C 36.6 C    Last Pain:  Vitals:   05/25/16 0700  TempSrc:   PainSc: 0-No pain   Pain Goal: Patients Stated Pain Goal: 5 (05/25/16 0700)               Madison HickmanGREGORY,SUZANNE

## 2016-05-25 NOTE — Addendum Note (Signed)
Addendum  created 05/25/16 [id number] by Shanon PayorSuzanne M Zi Sek, CRNA   Sign clinical note

## 2016-05-25 NOTE — Lactation Note (Signed)
This note was copied from a baby's chart. Lactation Consultation Note  Patient Name: [name] RUEAV'WToday's Date: 05/25/2016 Reason for consult: Follow-up assessment   Follow up with mom of 25 hour old infant. Mom reports BF is going well and she has no concerns at this time. She denies nipple tenderness and does not feel fuller today. Enc her to keep feeding infant 8-12 x in 24 hours. She declined needing assistance at this time. Enc mom to call out to desk for feeding assistance as needed.   Maternal Data    Feeding Feeding Type: Breast Fed Length of feed: 30 min  LATCH Score/Interventions                      Lactation Tools Discussed/Used     Consult Status Consult Status: Follow-up Date: 05/26/16 Follow-up type: In-patient    Silas FloodSharon S Jullian Clayson 05/25/2016, 3:04 PM

## 2016-05-26 ENCOUNTER — Encounter (HOSPITAL_COMMUNITY): Payer: Self-pay

## 2016-05-26 MED ORDER — OXYCODONE-ACETAMINOPHEN 5-325 MG PO TABS: 1.0000 | ORAL_TABLET | ORAL | 0 refills | Status: DC | PRN

## 2016-05-26 MED ORDER — IBUPROFEN 600 MG PO TABS: 600.0000 mg | ORAL_TABLET | Freq: Four times a day (QID) | ORAL | 0 refills | Status: DC | PRN

## 2016-05-26 NOTE — Discharge Summary (Signed)
Cesarean Section Delivery Discharge Summary  [name]  DOB:    05/25/1980 MRN:    [id number] CSN:    [id number]  Date of admission:                  05/22/2016  Date of discharge:                   05/26/2016  Procedures this admission:  Date of Delivery: 05/24/2016 Cesarean Section Procedure Note  Indications: failure to progress: arrest of dilation  Pre-operative Diagnosis: 39 week 2 day pregnancy.  Post-operative Diagnosis: same  Surgeon: Jessee AversOLE,TARA J.   Assistants: Illene BolusLori Kendarius Vigen CNM  Anesthesia: Spinal anesthesia  ASA Class: 2   Procedure Details   The patient was seen in the Holding Room. The risks, benefits, complications, treatment options, and expected outcomes were discussed with the patient.  The patient concurred with the proposed plan, giving informed consent.  The site of surgery properly noted/marked. The patient was taken to Operating Room # 1, identified as [name] and the procedure verified as C-Section Delivery. A Time Out was held and the above information confirmed.  After induction of anesthesia, the patient was draped and prepped in the usual sterile manner. A Pfannenstiel incision was made and carried down through the subcutaneous tissue to the fascia. Fascial incision was made and extended transversely. The fascia was separated from the underlying rectus tissue superiorly and inferiorly. The peritoneum was identified and entered. Peritoneal incision was extended longitudinally. The utero-vesical peritoneal reflection was incised transversely and the bladder flap was bluntly freed from the lower uterine segment. A low transverse uterine incision was made. Delivered from cephalic presentation was a  Female with Apgar scores of 9 at one minute and 10 at five minutes. After the umbilical cord was clamped and cut cord blood was obtained for evaluation. The placenta was removed intact and appeared normal. The uterine outline, tubes and ovaries  appeared normal. The uterine incision was closed with running locked sutures of 0 vicryl. . Hemostasis was observed. Lavage was carried out until clear. The fascia was then reapproximated with running sutures of 0 pds. The skin was reapproximated with 4-0 vicryl .  Instrument, sponge, and needle counts were correct prior the abdominal closure and at the conclusion of the case.   Findings: Female infant in cephalic presentation Normal appearing fallopian tubes and ovaries   Estimated Blood Loss:  1100 mL         Drains: None         Total IV Fluids:  Per anesthesia ml         Specimens: Placenta and Disposition:  Sent to Pathology          Implants: None          Complications:  None; patient tolerated the procedure well.         Disposition: PACU - hemodynamically stable.         Condition: stable  Attending Attestation: I performed the procedure.  Newborn Data:  Live born female  Birth Weight: 7 lb 11.6 oz (3505 g) APGAR: 9, 10  Home with mother. Name:  Circumcision Plan: none  History of Present Illness:none  [name] is a 36 y.o. female, [id number], who presents at 443w2d weeks gestation. [facility]   Her pregnancy has been complicated by:  Patient Active Problem List  Diagnosis Date Noted  . AMA (advanced maternal age) multigravida 35+ 05/25/2016  . Gestational thrombocytopenia without hemorrhage (HCC) 05/25/2016  . Gestational hypertension w/o significant proteinuria in 3rd trimester 05/22/2016  . S/P repeat low transverse C-section 07/22/2013    Hospital Course--Unscheduled Cesarean:  Admitted 05/22/2016 GBS. Positive Utilized Epidural for pain management.   .  Due to failure to progress,arrest of dilation, she was consented for cesarean, with Dr. Richardson Dopp performing a Repeat LTCS under epidural anesthesia, with delivery of a viable female , with  weight and Apgars 9/10 as listed below. Infant was in good condition and remained at the patient's bedside.  The patient was taken to recovery in good condition.  Patient planned to breast feed.  On post-op day 1, patient was doing well, tolerating a regular diet, with Hgb of .  Throughout her stay, her physical exam was WNL, her incision was CDI, and her vital signs remained stable.  By post-op day 1, she was up ad lib, tolerating a regular diet, with good pain control with po med.  She was deemed to have received the full benefit of her hospital stay, and was discharged home in stable condition.  Contraceptive choice was undecided.    Feeding:  breast  Contraception:  no method  Hemoglobin Results:  CBC Latest Ref Rng & Units 05/25/2016 05/24/2016 05/24/2016  WBC 4.0 - 10.5 K/uL 20.6(H) 15.4(H) 19.2(H)  Hemoglobin 12.0 - 15.0 g/dL 9.1(Y) 7.8(G) 11.8(L)  Hematocrit 36.0 - 46.0 % 23.8(L) 28.0(L) 35.5(L)  Platelets 150 - 400 K/uL 128(L) 109(L) 135(L)     Discharge Physical Exam:   General: alert, cooperative and appears stated age 30: appropriate Uterine Fundus: firm Abdomen:  + bowel sounds,  Incision: no significant drainage, honeycomb  DVT Evaluation: No evidence of DVT seen on physical exam.  Intrapartum Procedures: cesarean: low cervical, transverse and GBS prophylaxis Postpartum Procedures: antibiotics Complications-Operative and Postpartum: none  Discharge Diagnoses: Term Pregnancy-delivered  Discharge Information:  Activity:           pelvic rest Diet:                routine Medications: PNV, Ibuprofen, Iron and Percocet Condition:      stable Instructions:  Discharge to: home  Follow-up Information    [facility]. Schedule an appointment as soon as possible for a visit in 1 week(s).   Specialty:  Obstetrics and Gynecology Contact information: [address] [place] [address] [phone]            Rhea Pink CNM 05/26/2016 10:32 AM

## 2016-05-26 NOTE — Lactation Note (Signed)
This note was copied from a baby's chart. Lactation Consultation Note  Patient Name: [name] LKGMW'NToday's Date: 05/26/2016  Follow up visit made.  Mom just finished a breastfeeding and states feedings are going well.  She states breasts are comfortable and starting to fill.  Requesting coconut oil for nipple tenderness.  Encouraged to call with concerns/assist.   Maternal Data    Feeding Feeding Type: Breast Fed Length of feed: 15 min  LATCH Score/Interventions                      Lactation Tools Discussed/Used     Consult Status      Huston FoleyMOULDEN, [name] 05/26/2016, 10:34 AM

## 2016-05-26 NOTE — Progress Notes (Signed)
Saw pt in consultation for persistent bilateral numbness on her butt and upper outer thighs. Pt reports up and out of bed today. Went to the bathroom and took a shower unassisted. States that previous "weakness" felling now gone but with persistent numbness in her upper thighs and glutes. Reassured pt that numbness in those particular areas could be a result of lateral femoral cutaneous nerve compression during pregnancy or delivery, or a result of prolonged bedrest during current hospitalization. Given that she is ambulating without assistance without any perceived weakness, there is little concern for serious complications related to her epidural. Instructed pt to let anesthesia know if she has any return of weakness while hospitalized and to come to emergency room once she is discharged if she has any return of concerning signs while at home. Pt expressed understanding of instructions and further reassurance was offered.  Marcene Duosobert Akaya Proffit, MD

## 2016-05-26 NOTE — Addendum Note (Signed)
Addendum  created 05/26/16 1023 by Marcene Duosobert Latashia Koch, MD   Sign clinical note

## 2016-05-26 NOTE — Discharge Instructions (Signed)
Postpartum Care After Cesarean Delivery °The period of time right after you deliver your newborn is called the postpartum period. °What kind of medical care will I receive? °· You may continue to receive fluids and medicines through an IV tube inserted into one of your veins. °· You may have small, flexible tube (catheter) draining urine from your bladder into a bag outside of your body. The catheter will be removed as soon as possible. °· You may be given a squirt bottle to use when you go to the bathroom. You may use this until you are comfortable wiping as usual. To use the squirt bottle, follow these steps: °¨ Before you urinate, fill the squirt bottle with warm water. The water should be warm. Do not use hot water. °¨ After you urinate, while you are sitting on the toilet, use the squirt bottle to rinse the area around your urethra and vaginal opening. This rinses away any urine and blood. °¨ You may do this instead of wiping. As you start healing, you may use the squirt bottle before wiping yourself. Make sure to wipe gently. °¨ Fill the squirt bottle with clean water every time you use the bathroom. °· You will be given sanitary pads to wear. °· Your incision will be monitored to make sure it is healing properly. You will be told when it is safe for your stitches, staples, or skin adhesive tape to be removed. °What can I expect? °· You may not feel the need to urinate for several hours after delivery. °· You will have some soreness and pain in your abdomen. You may have a small amount of blood or clear fluid coming from your incision. °· If you are breastfeeding, you may have uterine contractions every time you breastfeed for up to several weeks postpartum. Uterine contractions help your uterus return to its normal size. °· It is normal to have vaginal bleeding (lochia) after delivery. The amount and appearance of lochia is often similar to a menstrual period in the first week after delivery. It will  gradually decrease over the next few weeks to a dry, yellow-brown discharge. For most women, lochia stops completely by 6-8 weeks after delivery. Vaginal bleeding can vary from woman to woman. °· Within the first few days after delivery, you may have breast engorgement. This is when your breasts feel heavy, full, and uncomfortable. Your breasts may also throb and feel hard, tightly stretched, warm, and tender. After this occurs, you may have milk leaking from your breasts. Your health care provider can help you relieve discomfort due to breast engorgement. Breast engorgement should go away within a few days. °· You may feel more sad or worried than normal due to hormonal changes after delivery. These feelings should not last more than a few days. If these feelings do not go away after several days, speak with your health care provider. °How should I care for myself? °· Tell your health care provider if you have pain or discomfort. °· Drink enough water to keep your urine clear or pale yellow. °· Wash your hands thoroughly with soap and water for at least 20 seconds after changing your sanitary pads or using the toilet, and before holding or feeding your baby. °· If you are not breastfeeding, avoid touching your breasts a lot. Doing this can make your breasts produce more milk. °· If you become weak or lightheaded, or you feel like you might faint, ask for help before: °¨ Getting out of bed. °¨ Showering. °·   Change your sanitary pads frequently. Watch for any changes in your flow, such as a sudden increase in volume, a change in color, or the passing of large blood clots. If you pass a blood clot from your vagina, save it to show to your health care provider. Do not flush blood clots down the toilet without having your health care provider look at them.  Make sure that all your vaccinations are up to date. This can help protect you and your baby from getting certain diseases. You may need to have immunizations done  before you leave the hospital.  If desired, talk with your health care provider about methods of family planning or birth control (contraception). How can I start bonding with my baby? Spending as much time as possible with your baby is very important. During this time, you and your baby can get to know each other and develop a bond. Having your baby stay with you in your room (rooming in) can give you time to get to know your baby. Rooming in can also help you become comfortable caring for your baby. Breastfeeding can also help you bond with your baby. How can I plan for returning home with my baby?  Make sure that you have a car seat installed in your vehicle.  Your car seat should be checked by a certified car seat installer to make sure that it is installed safely.  Make sure that your baby fits into the car seat safely.  Ask your health care provider any questions you have about caring for yourself or your baby. Make sure that you are able to contact your health care provider with any questions after leaving the hospital. This information is not intended to replace advice given to you by your health care provider. Make sure you discuss any questions you have with your health care provider. Document Released: 01/28/2012 Document Revised: 10/08/2015 Document Reviewed: 04/09/2015 Elsevier Interactive Patient Education  2017 [employer] Postpartum Depression and Baby Blues The postpartum period begins right after the birth of a baby. During this time, there is often a great amount of joy and excitement. It is also a time of many changes in the life of the parents. Regardless of how many times a mother gives birth, each child brings new challenges and dynamics to the family. It is not unusual to have feelings of excitement along with confusing shifts in moods, emotions, and thoughts. All mothers are at risk of developing postpartum depression or the "baby blues." These mood changes can occur  right after giving birth, or they may occur many months after giving birth. The baby blues or postpartum depression can be mild or severe. Additionally, postpartum depression can go away rather quickly, or it can be a long-term condition. What are the causes? Raised hormone levels and the rapid drop in those levels are thought to be a main cause of postpartum depression and the baby blues. A number of hormones change during and after pregnancy. Estrogen and progesterone usually decrease right after the delivery of your baby. The levels of thyroid hormone and various cortisol steroids also rapidly drop. Other factors that play a role in these mood changes include major life events and genetics. What increases the risk? If you have any of the following risks for the baby blues or postpartum depression, know what symptoms to watch out for during the postpartum period. Risk factors that may increase the likelihood of getting the baby blues or postpartum depression include:  Having a  personal or family history of depression.  Having depression while being pregnant.  Having premenstrual mood issues or mood issues related to oral contraceptives.  Having a lot of life stress.  Having marital conflict.  Lacking a social support network.  Having a baby with special needs.  Having health problems, such as diabetes. What are the signs or symptoms? Symptoms of baby blues include:  Brief changes in mood, such as going from extreme happiness to sadness.  Decreased concentration.  Difficulty sleeping.  Crying spells, tearfulness.  Irritability.  Anxiety. Symptoms of postpartum depression typically begin within the first month after giving birth. These symptoms include:  Difficulty sleeping or excessive sleepiness.  Marked weight loss.  Agitation.  Feelings of worthlessness.  Lack of interest in activity or food. Postpartum psychosis is a very serious condition and can be dangerous.  Fortunately, it is rare. Displaying any of the following symptoms is cause for immediate medical attention. Symptoms of postpartum psychosis include:  Hallucinations and delusions.  Bizarre or disorganized behavior.  Confusion or disorientation. How is this diagnosed? A diagnosis is made by an evaluation of your symptoms. There are no medical or lab tests that lead to a diagnosis, but there are various questionnaires that a health care provider may use to identify those with the baby blues, postpartum depression, or psychosis. Often, a screening tool called the [place] Postnatal Depression Scale is used to diagnose depression in the postpartum period. How is this treated? The baby blues usually goes away on its own in 1-2 weeks. Social support is often all that is needed. You will be encouraged to get adequate sleep and rest. Occasionally, you may be given medicines to help you sleep. Postpartum depression requires treatment because it can last several months or longer if it is not treated. Treatment may include individual or group therapy, medicine, or both to address any social, physiological, and psychological factors that may play a role in the depression. Regular exercise, a healthy diet, rest, and social support may also be strongly recommended. Postpartum psychosis is more serious and needs treatment right away. Hospitalization is often needed. Follow these instructions at home:  Get as much rest as you can. Nap when the baby sleeps.  Exercise regularly. Some women find yoga and walking to be beneficial.  Eat a balanced and nourishing diet.  Do little things that you enjoy. Have a cup of tea, take a bubble bath, read your favorite magazine, or listen to your favorite music.  Avoid alcohol.  Ask for help with household chores, cooking, grocery shopping, or running errands as needed. Do not try to do everything.  Talk to people close to you about how you are feeling. Get support  from your partner, family members, friends, or other new moms.  Try to stay positive in how you think. Think about the things you are grateful for.  Do not spend a lot of time alone.  Only take over-the-counter or prescription medicine as directed by your health care provider.  Keep all your postpartum appointments.  Let your health care provider know if you have any concerns. Contact a health care provider if: You are having a reaction to or problems with your medicine. Get help right away if:  You have suicidal feelings.  You think you may harm the baby or someone else. This information is not intended to replace advice given to you by your health care provider. Make sure you discuss any questions you have with your health care provider. Document  Released: 02/07/2004 Document Revised: 10/11/2015 Document Reviewed: 02/14/2013 Elsevier Interactive Patient Education  2017 [employer]

## 2016-05-26 NOTE — Discharge Summary (Signed)
OB Discharge Summary     Patient Name: [name] DOB: 11/22/1980 MRN: [id number]  Date of admission: 05/22/2016 Delivering MD: Gerald LeitzOLE, TARA   Date of discharge: 05/26/2016  Admitting diagnosis: 39WKS, PRECLAMPTIC EVAL Intrauterine pregnancy: 3314w2d     Secondary diagnosis:  Active Problems:   S/P repeat low transverse C-section   Gestational hypertension w/o significant proteinuria in 3rd trimester   AMA (advanced maternal age) multigravida 35+   Gestational thrombocytopenia without hemorrhage (HCC)  Additional problems: None     Discharge diagnosis: Gestational Hypertension     Term delivery                                                                                          Post partum procedures:None  Augmentation: AROM and Pitocin  Complications: None  Hospital course:  Induction of Labor With Cesarean Section  36 y.o. yo [id number] at 2214w2d was admitted to the hospital 05/22/2016 for induction of labor. Patient had a labor course significant for slow progress. The patient went for cesarean section due to Arrest of Dilation, and delivered a Viable infant,@BABYSUPPRESS (DBLINK,ept,110,,1,,) Membrane Rupture Time/Date: )12:32 AM ,05/23/2016   @Details  of operation can be found in separate operative Note.  Patient had an uncomplicated postpartum course. She is ambulating, tolerating a regular diet, passing flatus, and urinating well.  Patient is discharged home in stable condition on 05/26/16.                                     Physical exam Vitals:   05/26/16 0816 05/26/16 1121 05/26/16 1538 05/26/16 1931  BP: 119/74 (!) 124/56 117/70 126/73  Pulse: 74 80 79 90  Resp: 20 20 20 20   Temp: 99 F (37.2 C) 97.7 F (36.5 C) 98.3 F (36.8 C) 99.3 F (37.4 C)  TempSrc: Oral Oral Oral Oral  SpO2: 98% 100% 100% 100%  Weight:      Height:       General: alert, cooperative and no distress Lochia: appropriate Uterine Fundus: firm Incision: Dressing is clean, dry, and  intact DVT Evaluation: No evidence of DVT seen on physical exam. Negative Homan's sign. Labs: Lab Results  Component Value Date   WBC 20.6 (H) 05/25/2016   HGB 8.4 (L) 05/25/2016   HCT 23.8 (L) 05/25/2016   MCV 74.8 (L) 05/25/2016   PLT 128 (L) 05/25/2016   CMP Latest Ref Rng & Units 05/25/2016  Glucose 65 - 99 mg/dL 147(W110(H)  BUN 6 - 20 mg/dL 8  Creatinine 2.950.44 - 6.211.00 mg/dL 3.080.95  Sodium 657135 - 846145 mmol/L 132(L)  Potassium 3.5 - 5.1 mmol/L 3.6  Chloride 101 - 111 mmol/L 102  CO2 22 - 32 mmol/L 23  Calcium 8.9 - 10.3 mg/dL 7.8(L)  Total Protein 6.5 - 8.1 g/dL 9.6(E4.4(L)  Total Bilirubin 0.3 - 1.2 mg/dL 0.4  Alkaline Phos 38 - 126 U/L 146(H)  AST 15 - 41 U/L 25  ALT 14 - 54 U/L 25    Discharge instruction: per After Visit Summary and "Baby and Me Booklet".  After  visit meds:  Allergies as of 05/26/2016      Reactions   Penicillins Swelling, Rash   Amoxillin is OK. Has patient had a PCN reaction causing immediate rash, facial/tongue/throat swelling, SOB or lightheadedness with hypotension: Yes Has patient had a PCN reaction causing severe rash involving mucus membranes or skin necrosis: Yes Has patient had a PCN reaction that required hospitalization No Has patient had a PCN reaction occurring within the last 10 years: No If all of the above answers are "NO", then may proceed with Cephalosporin use.      Medication List    TAKE these medications   ferrous sulfate 325 (65 FE) MG tablet Take 325 mg by mouth 2 (two) times daily with a meal.   ibuprofen 600 MG tablet Commonly known as:  ADVIL,MOTRIN Take 1 tablet (600 mg total) by mouth every 6 (six) hours as needed for mild pain.   oxyCODONE-acetaminophen 5-325 MG tablet Commonly known as:  PERCOCET/ROXICET Take 1 tablet by mouth every 4 (four) hours as needed (pain scale 4-7).   prenatal multivitamin Tabs tablet Take 1 tablet by mouth daily at 12 noon.       Diet: routine diet  Activity: Advance as tolerated. Pelvic  rest for 6 weeks.   Outpatient follow up:6 weeks Follow up Appt:No future appointments. Follow up Visit:No Follow-up on file.  Postpartum contraception: Undecided  Newborn Data: Live born female  Birth Weight: 7 lb 11.6 oz (3505 g) APGAR: 9, 10  Baby Feeding: Breast Disposition:home with mother   05/26/2016 Kenney Houseman, CNM

## 2016-05-26 NOTE — Progress Notes (Signed)
Attempted to visit with patient to introduce spiritual care services and offer support during the patient's hospital stay and baby's stay in NICU.  Patient was unavailable.  Will continue to follow.  Please page as further needs arise.  Maryanna ShapeAmanda M. Carley Hammedavee Lomax, M.Div. [facility] Chaplain Pager [phone] Office [phone]

## 2016-05-27 NOTE — Lactation Note (Signed)
This note was copied from a baby's chart. Lactation Consultation Note  Patient Name: Wanda Ralene BatheJenneh Ruppel ZOXWR'UToday's Date: 05/27/2016  Mom's breasts are very full this AM.  She states baby continues to feed well.  She is post pumping with manual pump when not feeling enough softening.  Reviewed breast milk storage.  Instructed to massage breasts during feedings.  Encouraged to call with concerns prn.   Maternal Data Formula Feeding for Exclusion: No Reason for exclusion: Mother's choice to formula and breast feed on admission  Feeding    LATCH Score/Interventions                      Lactation Tools Discussed/Used     Consult Status      Huston FoleyMOULDEN, Mersades Barbaro S 05/27/2016, 10:51 AM

## 2016-05-28 ENCOUNTER — Observation Stay (HOSPITAL_COMMUNITY)
Admission: AD | Admit: 2016-05-28 | Discharge: 2016-05-29 | Disposition: A | Discharge status: Home / Self Care | Payer: 59 | Source: Ambulatory Visit | Attending: Obstetrics and Gynecology | Admitting: Obstetrics and Gynecology

## 2016-05-28 ENCOUNTER — Observation Stay (HOSPITAL_COMMUNITY): Payer: 59

## 2016-05-28 ENCOUNTER — Encounter (HOSPITAL_COMMUNITY): Payer: Self-pay

## 2016-05-28 DIAGNOSIS — O169 Unspecified maternal hypertension, unspecified trimester: Secondary | ICD-10-CM | POA: Diagnosis not present

## 2016-05-28 DIAGNOSIS — R200 Anesthesia of skin: Secondary | ICD-10-CM | POA: Diagnosis not present

## 2016-05-28 DIAGNOSIS — R202 Paresthesia of skin: Secondary | ICD-10-CM | POA: Insufficient documentation

## 2016-05-28 DIAGNOSIS — O1415 Severe pre-eclampsia, complicating the puerperium: Principal | ICD-10-CM | POA: Insufficient documentation

## 2016-05-28 DIAGNOSIS — M79601 Pain in right arm: Secondary | ICD-10-CM | POA: Diagnosis not present

## 2016-05-28 DIAGNOSIS — Z98891 History of uterine scar from previous surgery: Secondary | ICD-10-CM

## 2016-05-28 LAB — PROTEIN / CREATININE RATIO, URINE
CREATININE, URINE: 54 mg/dL
Protein Creatinine Ratio: 0.41 mg/mg{Cre} — ABNORMAL HIGH (ref 0.00–0.15)
Total Protein, Urine: 22 mg/dL

## 2016-05-28 LAB — URINALYSIS, ROUTINE W REFLEX MICROSCOPIC
Bilirubin Urine: NEGATIVE
Glucose, UA: NEGATIVE mg/dL
HGB URINE DIPSTICK: NEGATIVE
Ketones, ur: NEGATIVE mg/dL
LEUKOCYTES UA: NEGATIVE
Nitrite: NEGATIVE
Protein, ur: NEGATIVE mg/dL
SPECIFIC GRAVITY, URINE: 1.013 (ref 1.005–1.030)
pH: 8 (ref 5.0–8.0)

## 2016-05-28 LAB — COMPREHENSIVE METABOLIC PANEL
ALBUMIN: 2.5 g/dL — AB (ref 3.5–5.0)
ALT: 46 U/L (ref 14–54)
AST: 40 U/L (ref 15–41)
Alkaline Phosphatase: 139 U/L — ABNORMAL HIGH (ref 38–126)
Anion gap: 8 (ref 5–15)
BILIRUBIN TOTAL: 0.3 mg/dL (ref 0.3–1.2)
BUN: 10 mg/dL (ref 6–20)
CALCIUM: 9.1 mg/dL (ref 8.9–10.3)
CO2: 24 mmol/L (ref 22–32)
CREATININE: 0.76 mg/dL (ref 0.44–1.00)
Chloride: 104 mmol/L (ref 101–111)
GFR calc Af Amer: >60 mL/min (ref >60)
GLUCOSE: 78 mg/dL (ref 65–99)
POTASSIUM: 4.2 mmol/L (ref 3.5–5.1)
Sodium: 136 mmol/L (ref 135–145)
TOTAL PROTEIN: 5.4 g/dL — AB (ref 6.5–8.1)

## 2016-05-28 LAB — CBC
HEMATOCRIT: 25.4 % — AB (ref 36.0–46.0)
HEMOGLOBIN: 8.4 g/dL — AB (ref 12.0–15.0)
MCH: 25.6 pg — ABNORMAL LOW (ref 26.0–34.0)
MCHC: 33.1 g/dL (ref 30.0–36.0)
MCV: 77.4 fL — ABNORMAL LOW (ref 78.0–100.0)
Platelets: 267 10*3/uL (ref 150–400)
RBC: 3.28 MIL/uL — ABNORMAL LOW (ref 3.87–5.11)
RDW: 17.1 % — AB (ref 11.5–15.5)
WBC: 8.7 10*3/uL (ref 4.0–10.5)

## 2016-05-28 MED ORDER — OXYCODONE-ACETAMINOPHEN 5-325 MG PO TABS: 1.0000 | ORAL_TABLET | Freq: Four times a day (QID) | ORAL | Status: DC | PRN

## 2016-05-28 MED ORDER — LABETALOL HCL 5 MG/ML IV SOLN: 20.0000 mg | INTRAVENOUS | Status: DC | PRN

## 2016-05-28 MED ORDER — NIFEDIPINE ER OSMOTIC RELEASE 30 MG PO TB24
30.0000 mg | ORAL_TABLET | Freq: Once | ORAL | Status: AC
  Administered 2016-05-28: 30 mg via ORAL
  Filled 2016-05-28: qty 1

## 2016-05-28 MED ORDER — NIFEDIPINE ER OSMOTIC RELEASE 30 MG PO TB24: 30.0000 mg | ORAL_TABLET | Freq: Every day | ORAL | Status: DC

## 2016-05-28 MED ORDER — LABETALOL HCL 5 MG/ML IV SOLN
20.0000 mg | Freq: Once | INTRAVENOUS | Status: AC
  Administered 2016-05-28: 20 mg via INTRAVENOUS
  Filled 2016-05-28: qty 4

## 2016-05-28 MED ORDER — IBUPROFEN 600 MG PO TABS
600.0000 mg | ORAL_TABLET | Freq: Four times a day (QID) | ORAL | Status: DC | PRN
  Administered 2016-05-28 – 2016-05-29 (×2): 600 mg via ORAL
  Filled 2016-05-28 (×3): qty 1

## 2016-05-28 MED ORDER — OXYCODONE-ACETAMINOPHEN 5-325 MG PO TABS
1.0000 | ORAL_TABLET | ORAL | Status: DC | PRN
  Administered 2016-05-28 – 2016-05-29 (×3): 1 via ORAL
  Filled 2016-05-28 (×3): qty 1

## 2016-05-28 MED ORDER — HYDRALAZINE HCL 20 MG/ML IJ SOLN
10.0000 mg | Freq: Once | INTRAMUSCULAR | Status: AC
  Administered 2016-05-28: 10 mg via INTRAVENOUS
  Filled 2016-05-28: qty 1

## 2016-05-28 MED ORDER — PRENATAL MULTIVITAMIN CH: 1.0000 | ORAL_TABLET | Freq: Every day | ORAL | Status: DC

## 2016-05-28 MED ORDER — FERROUS SULFATE 325 (65 FE) MG PO TABS
325.0000 mg | ORAL_TABLET | Freq: Two times a day (BID) | ORAL | Status: DC
  Administered 2016-05-29: 325 mg via ORAL
  Filled 2016-05-28: qty 1

## 2016-05-28 MED ORDER — HYDRALAZINE HCL 20 MG/ML IJ SOLN: 10.0000 mg | Freq: Once | INTRAMUSCULAR | Status: DC | PRN

## 2016-05-28 MED ORDER — NIFEDIPINE ER OSMOTIC RELEASE 30 MG PO TB24
60.0000 mg | ORAL_TABLET | Freq: Every day | ORAL | Status: DC
  Filled 2016-05-28: qty 2

## 2016-05-28 NOTE — MAU Provider Note (Signed)
History     CSN: [id number]  Arrival date and time: 05/28/16 1714   First Provider Initiated Contact with Patient 05/28/16 1840      Chief Complaint  Patient presents with  . Numbness   HPI   [name] is a 36 y.o. female G15P2014 postpartum, status post cesarean section on 1/6 due to preeclampsia, failure to progress despite pitocin. She presents tonight with occasional numbness on the right side of her entire body. The numbness has been present since delivery. She was seen in the office today and sent here for further evaluation.   Denies HA, changes in vision, or upper abdominal pain.  Patient received Magnesium in the hospital.   OB History    Gravida Para Term Preterm AB Living   '5 4 2   1 4   '$ SAB TAB Ectopic Multiple Live Births   1     0 4      Past Medical History:  Diagnosis Date  . Anemia   . Headache   . Hypertension   . Pregnancy induced hypertension     Past Surgical History:  Procedure Laterality Date  . CESAREAN SECTION    . CESAREAN SECTION N/A 07/22/2013   Procedure: REPEAT CESAREAN SECTION;  Surgeon: Delice Lesch, MD;  Location: [facility];  Service: Obstetrics;  Laterality: N/A;  . CESAREAN SECTION N/A 05/24/2016   Procedure: CESAREAN SECTION;  Surgeon: Christophe Louis, MD;  Location: [place];  Service: Obstetrics;  Laterality: N/A;    History reviewed. No pertinent family history.  Social History  Substance Use Topics  . Smoking status: Never Smoker  . Smokeless tobacco: Never Used  . Alcohol use No    Allergies:  Allergies  Allergen Reactions  . Penicillins Swelling and Rash    Amoxillin is OK. Has patient had a PCN reaction causing immediate rash, facial/tongue/throat swelling, SOB or lightheadedness with hypotension: Yes Has patient had a PCN reaction causing severe rash involving mucus membranes or skin necrosis: Yes Has patient had a PCN reaction that required hospitalization No Has patient had a PCN reaction occurring  within the last 10 years: No If all of the above answers are "NO", then may proceed with Cephalosporin use.     Prescriptions Prior to Admission  Medication Sig Dispense Refill Last Dose  . ferrous sulfate 325 (65 FE) MG tablet Take 325 mg by mouth 2 (two) times daily with a meal.   05/27/2016 at Unknown time  . ibuprofen (ADVIL,MOTRIN) 600 MG tablet Take 1 tablet (600 mg total) by mouth every 6 (six) hours as needed for mild pain. 30 tablet 0 05/28/2016 at Unknown time  . oxyCODONE-acetaminophen (PERCOCET/ROXICET) 5-325 MG tablet Take 1 tablet by mouth every 4 (four) hours as needed (pain scale 4-7). 30 tablet 0 05/28/2016 at Unknown time  . Prenatal Vit-Fe Fumarate-FA (PRENATAL MULTIVITAMIN) TABS tablet Take 1 tablet by mouth daily at 12 noon.   05/27/2016 at Unknown time   Results for orders placed or performed during the hospital encounter of 05/28/16 (from the past 48 hour(s))  Urinalysis, Routine w reflex microscopic     Status: Abnormal   Collection Time: 05/28/16  5:44 PM  Result Value Ref Range   Color, Urine STRAW (A) YELLOW   APPearance CLEAR CLEAR   Specific Gravity, Urine 1.013 1.005 - 1.030   pH 8.0 5.0 - 8.0   Glucose, UA NEGATIVE NEGATIVE mg/dL   Hgb urine dipstick NEGATIVE NEGATIVE   Bilirubin Urine NEGATIVE NEGATIVE  Ketones, ur NEGATIVE NEGATIVE mg/dL   Protein, ur NEGATIVE NEGATIVE mg/dL   Nitrite NEGATIVE NEGATIVE   Leukocytes, UA NEGATIVE NEGATIVE  Protein / creatinine ratio, urine     Status: Abnormal   Collection Time: 05/28/16  5:44 PM  Result Value Ref Range   Creatinine, Urine 54.00 mg/dL   Total Protein, Urine 22 mg/dL    Comment: NO NORMAL RANGE ESTABLISHED FOR THIS TEST   Protein Creatinine Ratio 0.41 (H) 0.00 - 0.15 mg/mg[Cre]  CBC     Status: Abnormal   Collection Time: 05/28/16  6:33 PM  Result Value Ref Range   WBC 8.7 4.0 - 10.5 K/uL   RBC 3.28 (L) 3.87 - 5.11 MIL/uL   Hemoglobin 8.4 (L) 12.0 - 15.0 g/dL   HCT 25.4 (L) 36.0 - 46.0 %   MCV 77.4  (L) 78.0 - 100.0 fL   MCH 25.6 (L) 26.0 - 34.0 pg   MCHC 33.1 30.0 - 36.0 g/dL   RDW 17.1 (H) 11.5 - 15.5 %   Platelets 267 150 - 400 K/uL  Comprehensive metabolic panel     Status: Abnormal   Collection Time: 05/28/16  6:33 PM  Result Value Ref Range   Sodium 136 135 - 145 mmol/L   Potassium 4.2 3.5 - 5.1 mmol/L   Chloride 104 101 - 111 mmol/L   CO2 24 22 - 32 mmol/L   Glucose, Bld 78 65 - 99 mg/dL   BUN 10 6 - 20 mg/dL   Creatinine, Ser 0.76 0.44 - 1.00 mg/dL   Calcium 9.1 8.9 - 10.3 mg/dL   Total Protein 5.4 (L) 6.5 - 8.1 g/dL   Albumin 2.5 (L) 3.5 - 5.0 g/dL   AST 40 15 - 41 U/L   ALT 46 14 - 54 U/L   Alkaline Phosphatase 139 (H) 38 - 126 U/L   Total Bilirubin 0.3 0.3 - 1.2 mg/dL   GFR calc non Af Amer >60 >60 mL/min   GFR calc Af Amer >60 >60 mL/min    Comment: (NOTE) The eGFR has been calculated using the CKD EPI equation. This calculation has not been validated in all clinical situations. eGFR's persistently <60 mL/min signify possible Chronic Kidney Disease.    Anion gap 8 5 - 15    Review of Systems  Eyes: Negative for photophobia and visual disturbance.  Gastrointestinal: Negative for abdominal pain.  Neurological: Positive for numbness (Occasional ). Negative for dizziness, seizures, facial asymmetry, speech difficulty, light-headedness and headaches.   Physical Exam   Blood pressure 154/97, pulse 97, temperature 98.1 F (36.7 C), temperature source Oral, resp. rate 18, last menstrual period 12/06/2015, SpO2 100 %, unknown if currently breastfeeding.   Patient Vitals for the past 24 hrs:  BP Temp Temp src Pulse Resp SpO2  05/28/16 2049 154/97 - - 97 18 -  05/28/16 2045 154/94 - - 97 - -  05/28/16 2034 161/92 - - 92 - -  05/28/16 2025 144/92 - - 93 - -  05/28/16 2015 146/88 - - 96 - -  05/28/16 2000 145/90 - - 101 - -  05/28/16 1950 150/93 - - 100 - -  05/28/16 1930 168/90 - - 102 - -  05/28/16 1915 160/99 - - 106 - -  05/28/16 1900 (!) 155/101 - - 95 -  -  05/28/16 1845 (!) 169/103 - - 88 - -  05/28/16 1836 156/96 - - 89 - -  05/28/16 1815 (!) 168/106 - - 84 - -  05/28/16 1759 Marland Kitchen)  158/104 98.1 F (36.7 C) Oral 94 18 100 %    Physical Exam  Constitutional: She is oriented to person, place, and time. She appears well-developed and well-nourished.  Non-toxic appearance. She does not have a sickly appearance. She does not appear ill. No distress.  HENT:  Head: Normocephalic.  Eyes: Pupils are equal, round, and reactive to light.  Respiratory: Effort normal.  GI: Soft. She exhibits no distension. There is no tenderness. There is no rebound and no guarding.  Musculoskeletal: She exhibits edema (2+ bilateral lower extremity edema. ).  Neurological: She is alert and oriented to person, place, and time. She has normal strength and normal reflexes. She displays normal reflexes. No sensory deficit. She exhibits normal muscle tone. She displays no seizure activity. Coordination and gait normal. GCS eye subscore is 4. GCS verbal subscore is 5. GCS motor subscore is 6.  Skin: Skin is warm. She is not diaphoretic.  Psychiatric: Her behavior is normal.    MAU Course  Procedures  None  MDM  Anesthesia called for evaluation per Dr. Fonnie Jarvis request.  IV established.  Hydralazine 10 mg IV  PIH labs including PCR Discussed patient with Dr. Mancel Bale @ 1935> continued severe range BP readings. Patient is asymptomatic. Procardia XL and labetalol IV ordered 20 mg  BP recheck after 1 hour of procardia and labetalol BP 154/94. Dr. Mancel Bale notified. Patient continues to have occasional numbness and tingling on the right side of her body. Denies HA or changes in vision.   Assessment and Plan   A:  1. Hypertension in pregnancy, preeclampsia, severe, postpartum condition   2. Numbness and tingling     P:  Admit for observation Labetalol protocol Procardia 30 mg XL daily Repeat PIH labs in the AM.  Call Dr. Mancel Bale for further orders.   Lezlie Lye, NP 05/28/2016 9:01 PM

## 2016-05-28 NOTE — MAU Note (Signed)
Patient states that she was seen in the office today for numbness and tingling in her entire right side. Patient states that this started on 05/25/15 after he c/s.

## 2016-05-28 NOTE — Consult Note (Signed)
CTSP for R sided numbness and weakness.  S/P C/S on 05/24/16 under epidural. Was seen on 1/8 for c/o buttocks and upper thing numbness.  Now she is having intermittent Right upper and lower extremity tingling and weakness.   On exam now, there is no tingling or weakness. Neuro exam shows sensation intact to gross touch bilaterally, upper and lowe extremities. Motor strength 5/5. Able to ambulate normally.  Denies back pain, headache or fever. VSS.  A/P: Intermittent Right Sided paresthesia with weakness. Because of involvment of upper and lower extremity, it it very unlikely to be related to the epidural for c-section on 1/6. Able to ambulate normally. Suggest outpatient Neurology consult if condition persists or worsens.  [employer]

## 2016-05-28 NOTE — MAU Note (Signed)
Anesthesia at bedside

## 2016-05-28 NOTE — H&P (Signed)
[name] is a 36 y.o. female who is postpartum, status post cesarean section on 1/6 due to preeclampsia, failure to progress despite pitocin. Patient also received MgSO4 infusion in the immediate PPP. She presents tonight with occasional numbness on the right side of her entire body. The numbness has been present since delivery.  Patient denies HA, changes in vision, or upper abdominal pain.   Patient Active Problem List   Diagnosis Date Noted  . Hypertension in pregnancy, preeclampsia, severe, postpartum condition 05/28/2016  . Numbness and tingling of right upper and lower extremity 05/28/2016  . AMA (advanced maternal age) multigravida 35+ 05/25/2016  . Gestational thrombocytopenia without hemorrhage (HCC) 05/25/2016  . Gestational hypertension w/o significant proteinuria in 3rd trimester 05/22/2016  . S/P repeat low transverse C-section 07/22/2013    OB History    Gravida Para Term Preterm AB Living   5 4 2   1 4    SAB TAB Ectopic Multiple Live Births   1     0 4     Past Medical History:  Diagnosis Date  . Anemia   . Headache   . Hypertension   . Pregnancy induced hypertension    Past Surgical History:  Procedure Laterality Date  . CESAREAN SECTION    . CESAREAN SECTION N/A 07/22/2013   Procedure: REPEAT CESAREAN SECTION;  Surgeon: Purcell NailsAngela Y Roberts, MD;  Location: [facility];  Service: Obstetrics;  Laterality: N/A;  . CESAREAN SECTION N/A 05/24/2016   Procedure: CESAREAN SECTION;  Surgeon: Gerald Leitzara Cole, MD;  [facility];  Service: Obstetrics;  Laterality: N/A;   Family History: family history is not on file. Social History:  reports that she has never smoked. She has never used smokeless tobacco. She reports that she does not drink alcohol or use drugs.      Results for orders placed or performed during the hospital encounter of 05/28/16 (from the past 24 hour(s))  Urinalysis, Routine w reflex microscopic     Status: Abnormal   Collection Time: 05/28/16   5:44 PM  Result Value Ref Range   Color, Urine STRAW (A) YELLOW   APPearance CLEAR CLEAR   Specific Gravity, Urine 1.013 1.005 - 1.030   pH 8.0 5.0 - 8.0   Glucose, UA NEGATIVE NEGATIVE mg/dL   Hgb urine dipstick NEGATIVE NEGATIVE   Bilirubin Urine NEGATIVE NEGATIVE   Ketones, ur NEGATIVE NEGATIVE mg/dL   Protein, ur NEGATIVE NEGATIVE mg/dL   Nitrite NEGATIVE NEGATIVE   Leukocytes, UA NEGATIVE NEGATIVE  Protein / creatinine ratio, urine     Status: Abnormal   Collection Time: 05/28/16  5:44 PM  Result Value Ref Range   Creatinine, Urine 54.00 mg/dL   Total Protein, Urine 22 mg/dL   Protein Creatinine Ratio 0.41 (H) 0.00 - 0.15 mg/mg[Cre]  CBC     Status: Abnormal   Collection Time: 05/28/16  6:33 PM  Result Value Ref Range   WBC 8.7 4.0 - 10.5 K/uL   RBC 3.28 (L) 3.87 - 5.11 MIL/uL   Hemoglobin 8.4 (L) 12.0 - 15.0 g/dL   HCT 16.125.4 (L) 09.636.0 - 04.546.0 %   MCV 77.4 (L) 78.0 - 100.0 fL   MCH 25.6 (L) 26.0 - 34.0 pg   MCHC 33.1 30.0 - 36.0 g/dL   RDW 40.917.1 (H) 81.111.5 - 91.415.5 %   Platelets 267 150 - 400 K/uL  Comprehensive metabolic panel     Status: Abnormal   Collection Time: 05/28/16  6:33 PM  Result Value Ref  Range   Sodium 136 135 - 145 mmol/L   Potassium 4.2 3.5 - 5.1 mmol/L   Chloride 104 101 - 111 mmol/L   CO2 24 22 - 32 mmol/L   Glucose, Bld 78 65 - 99 mg/dL   BUN 10 6 - 20 mg/dL   Creatinine, Ser 1.61 0.44 - 1.00 mg/dL   Calcium 9.1 8.9 - 09.6 mg/dL   Total Protein 5.4 (L) 6.5 - 8.1 g/dL   Albumin 2.5 (L) 3.5 - 5.0 g/dL   AST 40 15 - 41 U/L   ALT 46 14 - 54 U/L   Alkaline Phosphatase 139 (H) 38 - 126 U/L   Total Bilirubin 0.3 0.3 - 1.2 mg/dL   GFR calc non Af Amer >60 >60 mL/min   GFR calc Af Amer >60 >60 mL/min   Anion gap 8 5 - 15   Vitals:   05/28/16 2025 05/28/16 2034 05/28/16 2045 05/28/16 2049  BP: 144/92 161/92 154/94 154/97  Pulse: 93 92 97 97  Resp:    18  Temp:      TempSrc:      SpO2:        Review of Systems  Eyes: Negative.   Neurological:  Positive for tingling (Right Side). Negative for weakness.   History   Blood pressure 154/97, pulse 97, temperature 98.1 F (36.7 C), temperature source Oral, resp. rate 18, last menstrual period 12/06/2015, SpO2 100 %, unknown if currently breastfeeding. Exam Physical Exam  Vitals reviewed. Constitutional: She is oriented to person, place, and time. She appears well-developed and well-nourished. No distress.  HENT:  Head: Normocephalic and atraumatic.  Eyes: Conjunctivae are normal.  Neck: Normal range of motion.  Cardiovascular: Normal rate, regular rhythm and normal heart sounds.   Respiratory: Effort normal and breath sounds normal.  GI: Soft. Bowel sounds are normal. She exhibits no distension.  Steri-strips in place.  Musculoskeletal: Normal range of motion. She exhibits edema (+2, +3 in BLE).  Neurological: She is alert and oriented to person, place, and time.  Skin: Skin is warm and dry.    Prenatal labs: ABO, Rh: --/--/O POS, O POS (01/04 1325) Antibody: NEG (01/04 1325) Rubella: Immune (09/11 0000) RPR: Non Reactive (01/04 1327)  HBsAg: Negative (09/11 0000)  HIV: Non-reactive, Non-reactive (09/11 0000)  GBS: Positive (09/11 0000)   Assessment/Plan: PP Day 4 S/P Repeat C/S after Failed TOL PreEclampsia Numbness/Tingling-Right Side  Pt evaluated by anesthesia  Admit for observation by Dr. Alinda Sierras Labetalol protocol Procardia 60 mg XL daily Repeat PIH labs in the AM Pain Medication: Percocet 1-2 Q4hrs, Motrin 600mg ,  Other Meds: PNV, Fe+ Supplement BID  CT Pending; If negative refer to Dr. Eulis Manly via Nani Ravens  Cherre Robins MSN, CNM 05/28/2016, 9:10 PM

## 2016-05-28 NOTE — Addendum Note (Signed)
Addendum  created 05/28/16 1834 by Phillips GroutPeter Antuane Eastridge, MD   Sign clinical note

## 2016-05-28 NOTE — MAU Note (Signed)
Notified Dr. Acey Lavarignan that patient is here with numbness and tingling in her right side. Provider said he would come and evaluate patient.

## 2016-05-29 DIAGNOSIS — R202 Paresthesia of skin: Secondary | ICD-10-CM | POA: Diagnosis not present

## 2016-05-29 DIAGNOSIS — R200 Anesthesia of skin: Secondary | ICD-10-CM | POA: Diagnosis not present

## 2016-05-29 DIAGNOSIS — O1415 Severe pre-eclampsia, complicating the puerperium: Secondary | ICD-10-CM | POA: Diagnosis not present

## 2016-05-29 LAB — CBC
HEMATOCRIT: 26.3 % — AB (ref 36.0–46.0)
Hemoglobin: 8.7 g/dL — ABNORMAL LOW (ref 12.0–15.0)
MCH: 25.4 pg — ABNORMAL LOW (ref 26.0–34.0)
MCHC: 33.1 g/dL (ref 30.0–36.0)
MCV: 76.9 fL — AB (ref 78.0–100.0)
Platelets: 288 10*3/uL (ref 150–400)
RBC: 3.42 MIL/uL — ABNORMAL LOW (ref 3.87–5.11)
RDW: 17 % — AB (ref 11.5–15.5)
WBC: 9.1 10*3/uL (ref 4.0–10.5)

## 2016-05-29 LAB — COMPREHENSIVE METABOLIC PANEL
ALBUMIN: 2.8 g/dL — AB (ref 3.5–5.0)
ALK PHOS: 133 U/L — AB (ref 38–126)
ALT: 40 U/L (ref 14–54)
AST: 31 U/L (ref 15–41)
Anion gap: 8 (ref 5–15)
BILIRUBIN TOTAL: 0.4 mg/dL (ref 0.3–1.2)
BUN: 8 mg/dL (ref 6–20)
CO2: 23 mmol/L (ref 22–32)
Calcium: 8.8 mg/dL — ABNORMAL LOW (ref 8.9–10.3)
Chloride: 105 mmol/L (ref 101–111)
Creatinine, Ser: 0.55 mg/dL (ref 0.44–1.00)
GFR calc Af Amer: >60 mL/min (ref >60)
GFR calc non Af Amer: >60 mL/min (ref >60)
GLUCOSE: 80 mg/dL (ref 65–99)
POTASSIUM: 3.5 mmol/L (ref 3.5–5.1)
Sodium: 136 mmol/L (ref 135–145)
TOTAL PROTEIN: 6.2 g/dL — AB (ref 6.5–8.1)

## 2016-05-29 LAB — PROTEIN / CREATININE RATIO, URINE
Creatinine, Urine: 15 mg/dL
Protein Creatinine Ratio: 0.67 mg/mg{Cre} — ABNORMAL HIGH (ref 0.00–0.15)
TOTAL PROTEIN, URINE: 10 mg/dL

## 2016-05-29 LAB — URIC ACID: Uric Acid, Serum: 5 mg/dL (ref 2.3–6.6)

## 2016-05-29 LAB — LACTATE DEHYDROGENASE: LDH: 316 U/L — ABNORMAL HIGH (ref 98–192)

## 2016-05-29 MED ORDER — NIFEDIPINE ER 60 MG PO TB24: 60.0000 mg | ORAL_TABLET | Freq: Every day | ORAL | 4 refills | Status: DC

## 2016-05-29 NOTE — Progress Notes (Signed)
Pt is discharged in the care of husband. With R.N. Daine GipEscort. Denies any more pain . Discharged instructions with Rx were given to pt, with good understanding. Questions asked and answered. Stable. Stated she feels better now. BP is down .

## 2016-05-29 NOTE — Discharge Summary (Signed)
Discharge Summary Reason for Admission: Postpartum Numbness/Tingling Prenatal Procedures: none Intrapartum Procedures: N/A Postpartum Procedures: Head CT, IV Medication Complications-Operative and Postpartum: none Hemoglobin  Date Value Ref Range Status  05/28/2016 8.4 (L) 12.0 - 15.0 g/dL Final   HCT  Date Value Ref Range Status  05/28/2016 25.4 (L) 36.0 - 46.0 % Final    Physical Exam:  General: alert, cooperative and no distress Lochia: appropriate Uterine Fundus: Not assessed Incision: healing well DVT Evaluation: Calf/Ankle edema is present.  Discharge Diagnoses: PP PreEclampsia, Right Side Parethesia Intermittent  Discharge Information: Date: 05/29/2016 Activity: pelvic rest Diet: routine Medications: Procardia 60mg  XL Condition: stable Instructions: refer to practice specific booklet Discharge to: home  Wanda RobinsJessica L Gerrianne Aydelott MSN, CNM 05/29/2016, 7:38 AM

## 2016-06-02 DIAGNOSIS — G8918 Other acute postprocedural pain: Secondary | ICD-10-CM | POA: Diagnosis not present

## 2016-06-02 DIAGNOSIS — R202 Paresthesia of skin: Secondary | ICD-10-CM | POA: Diagnosis not present

## 2016-07-01 ENCOUNTER — Ambulatory Visit: Payer: 59 | Admitting: Neurology

## 2016-07-01 ENCOUNTER — Telehealth: Payer: Self-pay

## 2016-07-01 NOTE — Telephone Encounter (Signed)
PAtient did not show to her new patient appt.

## 2016-07-02 ENCOUNTER — Encounter: Payer: Self-pay | Admitting: Neurology

## 2016-07-07 DIAGNOSIS — D649 Anemia, unspecified: Secondary | ICD-10-CM | POA: Diagnosis not present

## 2016-07-07 DIAGNOSIS — Z01411 Encounter for gynecological examination (general) (routine) with abnormal findings: Secondary | ICD-10-CM | POA: Diagnosis not present

## 2016-07-30 DIAGNOSIS — Z6826 Body mass index (BMI) 26.0-26.9, adult: Secondary | ICD-10-CM | POA: Diagnosis not present

## 2016-07-30 DIAGNOSIS — Z139 Encounter for screening, unspecified: Secondary | ICD-10-CM | POA: Diagnosis not present

## 2016-07-30 DIAGNOSIS — Z124 Encounter for screening for malignant neoplasm of cervix: Secondary | ICD-10-CM | POA: Diagnosis not present

## 2016-07-30 DIAGNOSIS — Z3040 Encounter for surveillance of contraceptives, unspecified: Secondary | ICD-10-CM | POA: Diagnosis not present

## 2016-07-30 DIAGNOSIS — R5383 Other fatigue: Secondary | ICD-10-CM | POA: Diagnosis not present

## 2016-07-30 DIAGNOSIS — Z01411 Encounter for gynecological examination (general) (routine) with abnormal findings: Secondary | ICD-10-CM | POA: Diagnosis not present

## 2016-08-13 DIAGNOSIS — Z3040 Encounter for surveillance of contraceptives, unspecified: Secondary | ICD-10-CM | POA: Diagnosis not present

## 2016-10-16 DIAGNOSIS — E785 Hyperlipidemia, unspecified: Secondary | ICD-10-CM | POA: Diagnosis not present

## 2016-10-16 DIAGNOSIS — Z6827 Body mass index (BMI) 27.0-27.9, adult: Secondary | ICD-10-CM | POA: Diagnosis not present

## 2016-10-16 DIAGNOSIS — R030 Elevated blood-pressure reading, without diagnosis of hypertension: Secondary | ICD-10-CM | POA: Diagnosis not present

## 2016-10-24 NOTE — Addendum Note (Signed)
Addendum  created 10/24/16 0855 by Barbra Miner, MD   Sign clinical note    

## 2016-11-05 DIAGNOSIS — H11001 Unspecified pterygium of right eye: Secondary | ICD-10-CM | POA: Diagnosis not present

## 2017-01-15 DIAGNOSIS — Z6828 Body mass index (BMI) 28.0-28.9, adult: Secondary | ICD-10-CM | POA: Diagnosis not present

## 2017-01-15 DIAGNOSIS — I10 Essential (primary) hypertension: Secondary | ICD-10-CM | POA: Diagnosis not present

## 2017-01-15 DIAGNOSIS — E785 Hyperlipidemia, unspecified: Secondary | ICD-10-CM | POA: Diagnosis not present

## 2017-01-15 DIAGNOSIS — R635 Abnormal weight gain: Secondary | ICD-10-CM | POA: Diagnosis not present

## 2017-03-04 DIAGNOSIS — I10 Essential (primary) hypertension: Secondary | ICD-10-CM | POA: Diagnosis not present

## 2017-03-13 ENCOUNTER — Ambulatory Visit (INDEPENDENT_AMBULATORY_CARE_PROVIDER_SITE_OTHER): Payer: 59

## 2017-03-13 ENCOUNTER — Encounter (HOSPITAL_COMMUNITY): Payer: Self-pay | Admitting: *Deleted

## 2017-03-13 ENCOUNTER — Ambulatory Visit (HOSPITAL_COMMUNITY)
Admission: EM | Admit: 2017-03-13 | Discharge: 2017-03-13 | Disposition: A | Discharge status: Home / Self Care | Payer: 59 | Attending: Emergency Medicine | Admitting: Emergency Medicine

## 2017-03-13 DIAGNOSIS — M79671 Pain in right foot: Secondary | ICD-10-CM

## 2017-03-13 NOTE — ED Triage Notes (Signed)
Patient reports almost falling yesterday and twisting right ankle. Patient reports pain with ambulation. No swelling. Patient points across top of right foot in the middle where pain is. No pain distally.

## 2017-03-13 NOTE — Discharge Instructions (Signed)
Xray negative for fracture or dislocation. Given breastfeeding, avoid ibuprofen/naproxen. Continue tylenol for pain. Ice compress and elevation. Ace wrap during activity. Follow up with orthopedics if symptoms do not improve, worsens.

## 2017-03-13 NOTE — ED Provider Notes (Signed)
[facility]    CSN: [id number] Arrival date & time: 03/13/17  1226     History   Chief Complaint Chief Complaint  Patient presents with  . Ankle Pain    HPI [name] is a 36 y.o. female.   36 year old female with history of HTN comes in for 2 day history of right foot/ankle pain after inversion of ankle yesterday. Patient states she almost fell walking down the stairs, and landed on foot with ankle inverted. She states painful weight bearing and has been hopping throughout the day to prevent the pain. She has tried tylenol and warm compress with little relief. Pain is of the right proximal dorsal foot.       Past Medical History:  Diagnosis Date  . Anemia   . Headache   . Hypertension   . Pregnancy induced hypertension     Patient Active Problem List   Diagnosis Date Noted  . Hypertension in pregnancy, preeclampsia, severe, postpartum condition 05/28/2016  . Numbness and tingling of right upper and lower extremity 05/28/2016  . AMA (advanced maternal age) multigravida 35+ 05/25/2016  . Gestational thrombocytopenia without hemorrhage (HCC) 05/25/2016  . Gestational hypertension w/o significant proteinuria in 3rd trimester 05/22/2016  . S/P repeat low transverse C-section 07/22/2013    Past Surgical History:  Procedure Laterality Date  . CESAREAN SECTION    . CESAREAN SECTION N/A 07/22/2013   Procedure: REPEAT CESAREAN SECTION;  Surgeon: Purcell NailsAngela Y Roberts, MD;  Location: [facility];  Service: Obstetrics;  Laterality: N/A;  . CESAREAN SECTION N/A 05/24/2016   Procedure: CESAREAN SECTION;  Surgeon: Gerald Leitzara Cole, MD;  [facility];  Service: Obstetrics;  Laterality: N/A;    OB History    Gravida Para Term Preterm AB Living   5 4 2   1 4    SAB TAB Ectopic Multiple Live Births   1     0 4       Home Medications    Prior to Admission medications   Medication Sig Start Date End Date Taking? Authorizing Provider  ferrous sulfate 325 (65  FE) MG tablet Take 325 mg by mouth 2 (two) times daily with a meal.   Yes [provider]  Prenatal Vit-Fe Fumarate-FA (PRENATAL MULTIVITAMIN) TABS tablet Take 1 tablet by mouth daily at 12 noon.   Yes [provider]  ibuprofen (ADVIL,MOTRIN) 600 MG tablet Take 1 tablet (600 mg total) by mouth every 6 (six) hours as needed for mild pain. 05/26/16   Clemmons, Elmore GuiseLori A, CNM    Family History History reviewed. No pertinent family history.  Social History Social History  Substance Use Topics  . Smoking status: Never Smoker  . Smokeless tobacco: Never Used  . Alcohol use No     Allergies   Penicillins   Review of Systems Review of Systems  Reason unable to perform ROS: See HPI as above.     Physical Exam Triage Vital Signs ED Triage Vitals [03/13/17 1244]  Enc Vitals Group     BP      Pulse      Resp      Temp      Temp src      SpO2      Weight      Height      Head Circumference      Peak Flow      Pain Score 8     Pain Loc  Pain Edu?      Excl. in GC?    No data found.   Updated Vital Signs There were no vitals taken for this visit.  Physical Exam  Constitutional: She is oriented to person, place, and time. She appears well-developed and well-nourished. No distress.  HENT:  Head: Normocephalic and atraumatic.  Eyes: Pupils are equal, round, and reactive to light. Conjunctivae are normal.  Musculoskeletal:       Feet:  No obvious swelling, bruising noted. Tenderness to palpation of proximal dorsal foot. Full ROM. Strength decreased on right foot. Sensation intact. Pedal pulses 2+ and equal.  Neurological: She is alert and oriented to person, place, and time.     UC Treatments / Results  Labs (all labs ordered are listed, but only abnormal results are displayed) Labs Reviewed - No data to display  EKG  EKG Interpretation None       Radiology Dg Foot Complete Right  Result Date: 03/13/2017 CLINICAL DATA:  Superior foot  pain with limited weight-bearing after falling. Initial encounter. EXAM: RIGHT FOOT COMPLETE - 3+ VIEW COMPARISON:  None. FINDINGS: The mineralization and alignment are normal. There is no evidence of acute fracture or dislocation. The joint spaces are maintained. There is a small posterior calcaneal spur. No focal soft tissue swelling identified. IMPRESSION: No acute osseous findings. Electronically Signed   By: Carey Bullocks M.D.   On: 03/13/2017 13:07    Procedures Procedures (including critical care time)  Medications Ordered in UC Medications - No data to display   Initial Impression / Assessment and Plan / UC Course  I have reviewed the triage vital signs and the nursing notes.  Pertinent labs & imaging results that were available during my care of the patient were reviewed by me and considered in my medical decision making (see chart for details).    Xray negative for fracture/dislocation. Patient breastfeeding, take tylenol for pain. Ice compress and elevation. Ace wrap during activity. Return precautions given.   Final Clinical Impressions(s) / UC Diagnoses   Final diagnoses:  Foot pain, right    New Prescriptions Current Discharge Medication List        Lurline Idol 03/13/17 1330

## 2017-05-06 DIAGNOSIS — H11041 Peripheral pterygium, stationary, right eye: Secondary | ICD-10-CM | POA: Diagnosis not present

## 2018-05-06 IMAGING — CT CT HEAD W/O CM
3 series · 15 of 40 positions shown, 16 images · non-contrast
Comparison: None.

CLINICAL DATA: Acute onset of right arm numbness and tingling.
Initial encounter.

EXAM:
CT HEAD WITHOUT CONTRAST
TECHNIQUE: Contiguous axial images were obtained from the base of the skull
through the vertex without intravenous contrast.

[Series 4: routine head w/o · axial · non-contrast · 0.45mm/px · z∈[+1072,+1158]mm · 4 of 30 slices shown, 5 images]
[im 6/30  brain]
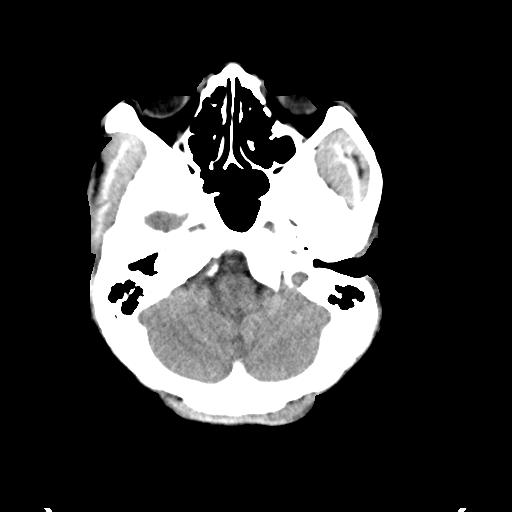
[im 6/30  bone]
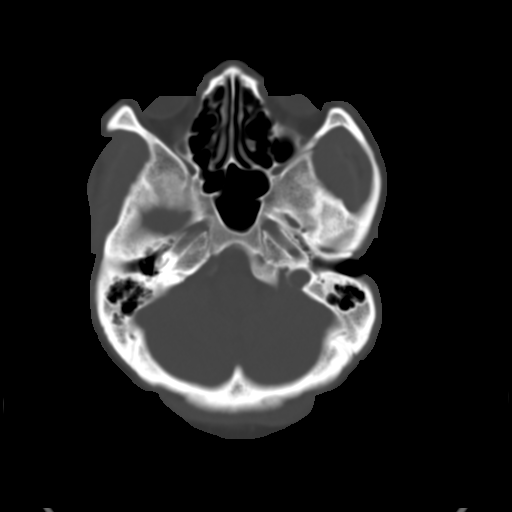
[im 12/30  brain]
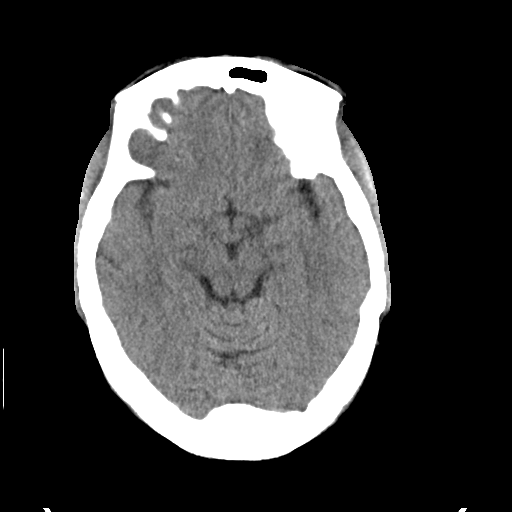
[im 18/30  brain]
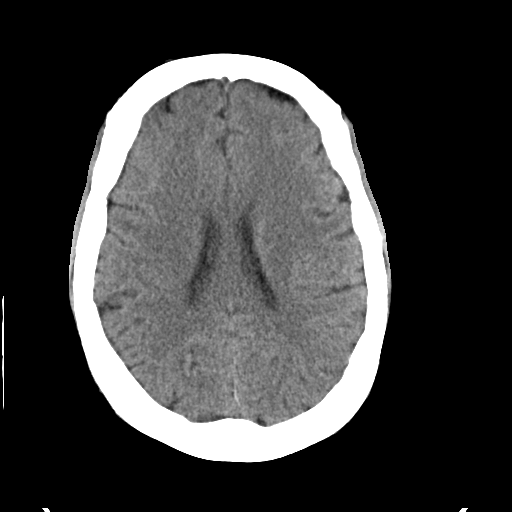
[im 24/30  brain]
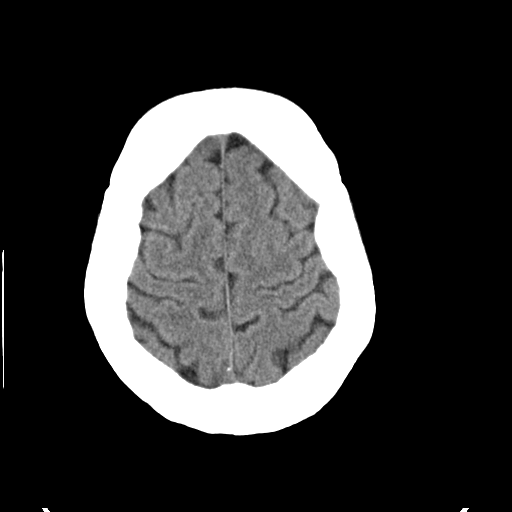

[Series 6: routine head bone · axial · 0.45mm/px · z∈[+1058,+1176]mm · 8 of 120 slices shown]
[im 11/120  bone]
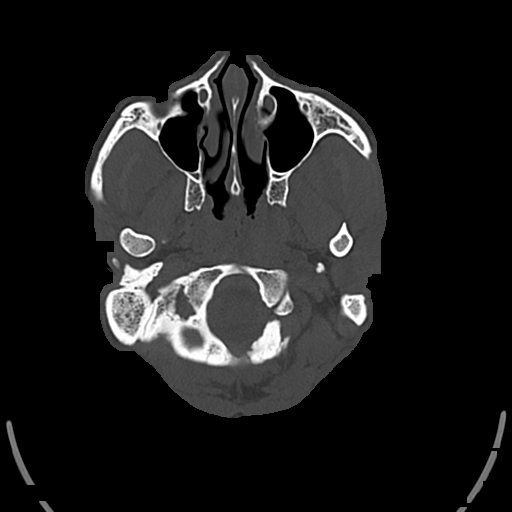
[im 26/120  bone]
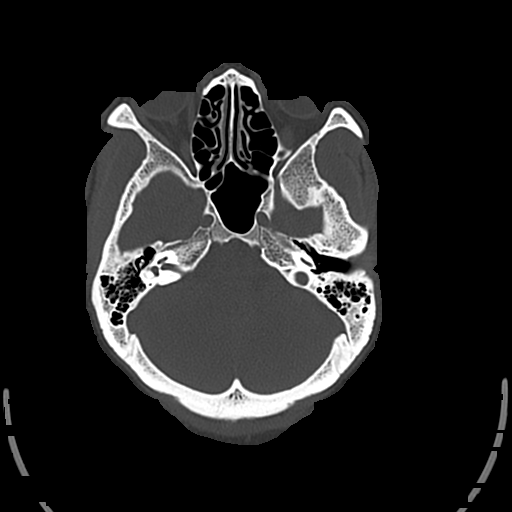
[im 37/120  bone]
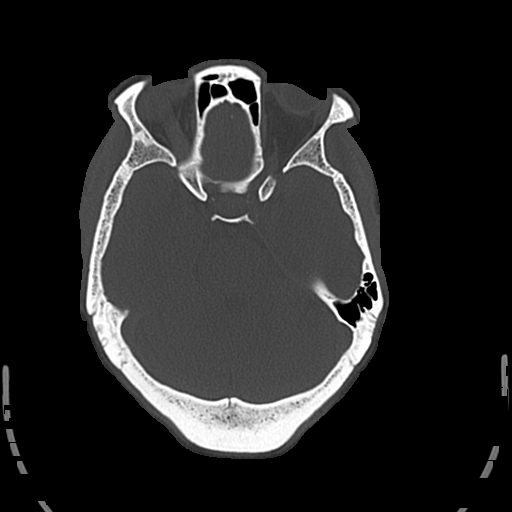
[im 52/120  bone]
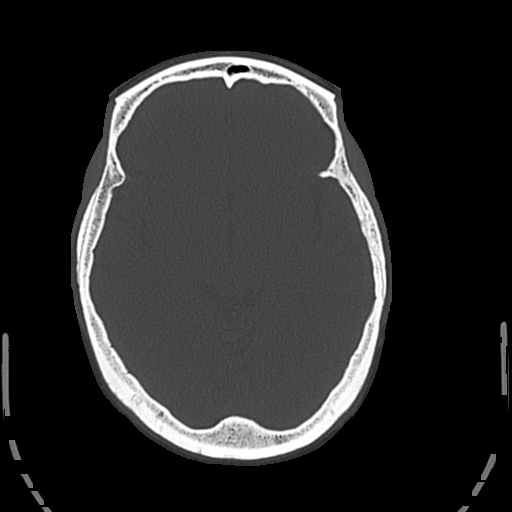
[im 68/120  bone]
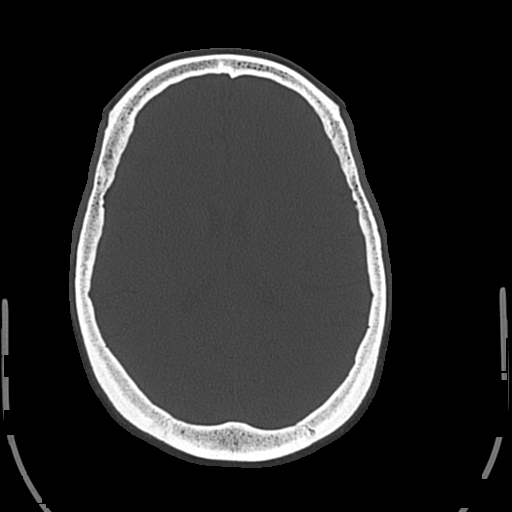
[im 83/120  bone]
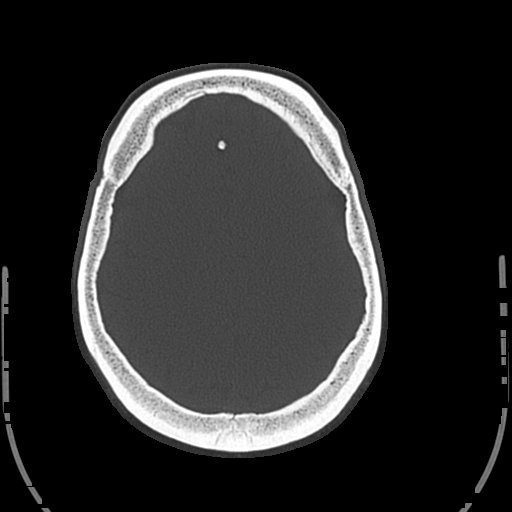
[im 94/120  bone]
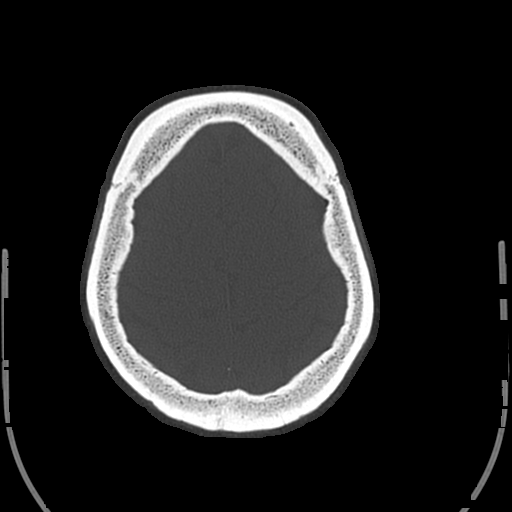
[im 109/120  bone]
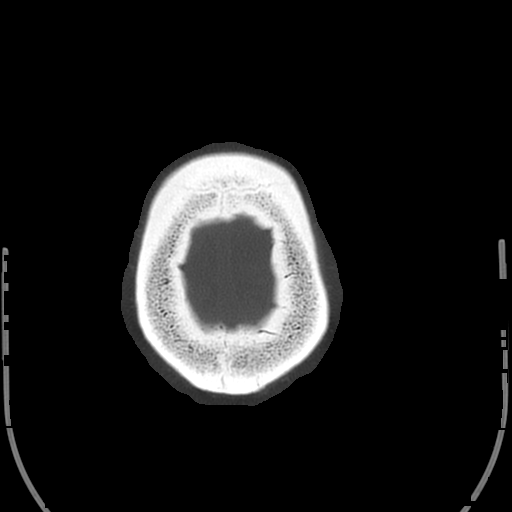

[Series 602: <mpr thick range> · coronal · 0.45mm/px · 3 of 97 slices shown]
[im 33/97  brain]
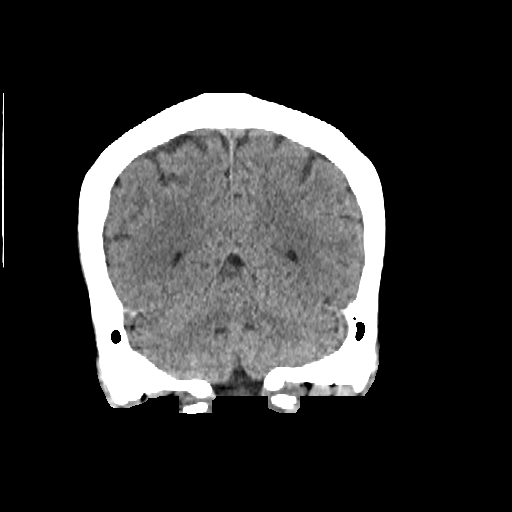
[im 43/97  brain]
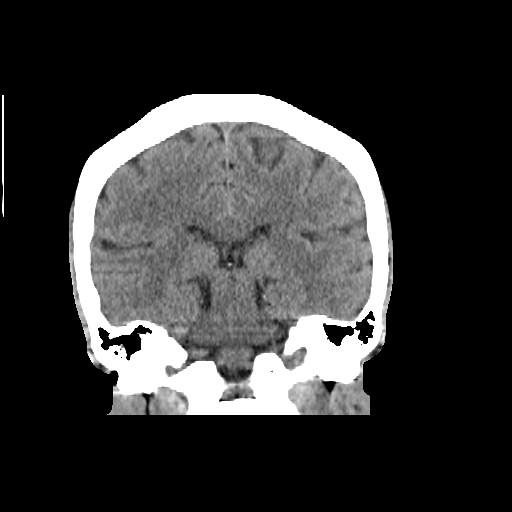
[im 54/97  brain]
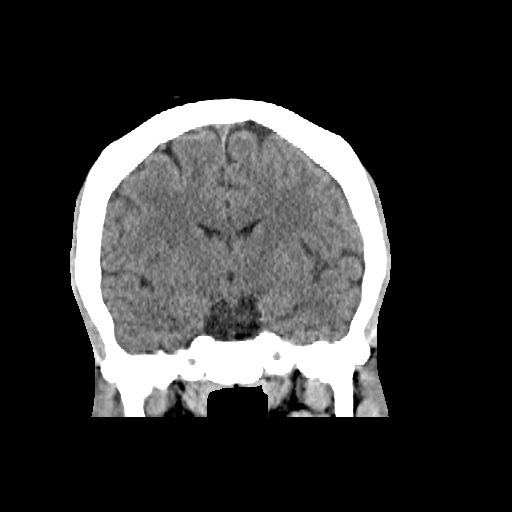

[15 of 40 positions shown; findings below may reference images not displayed]

FINDINGS: Brain: No evidence of acute infarction, hemorrhage, hydrocephalus,
extra-axial collection or mass lesion/mass effect.

The posterior fossa, including the cerebellum, brainstem and fourth
ventricle, is within normal limits. The third and lateral
ventricles, and basal ganglia are unremarkable in appearance. The
cerebral hemispheres are symmetric in appearance, with normal
gray-white differentiation. No mass effect or midline shift is seen.

Vascular: No hyperdense vessel or unexpected calcification.

Skull: There is no evidence of fracture; visualized osseous
structures are unremarkable in appearance.

Sinuses/Orbits: The orbits are within normal limits. The paranasal
sinuses and mastoid air cells are well-aerated.

Other: No significant soft tissue abnormalities are seen.
IMPRESSION: Unremarkable noncontrast CT of the head.

## 2018-09-30 ENCOUNTER — Other Ambulatory Visit: Payer: Self-pay

## 2018-09-30 ENCOUNTER — Emergency Department (HOSPITAL_COMMUNITY)
Admission: EM | Admit: 2018-09-30 | Discharge: 2018-09-30 | Disposition: A | Discharge status: Home / Self Care | Payer: BLUE CROSS/BLUE SHIELD | Attending: Emergency Medicine | Admitting: Emergency Medicine

## 2018-09-30 ENCOUNTER — Emergency Department (HOSPITAL_COMMUNITY): Payer: BLUE CROSS/BLUE SHIELD

## 2018-09-30 ENCOUNTER — Encounter (HOSPITAL_COMMUNITY): Payer: Self-pay | Admitting: Emergency Medicine

## 2018-09-30 DIAGNOSIS — Y9389 Activity, other specified: Secondary | ICD-10-CM | POA: Diagnosis not present

## 2018-09-30 DIAGNOSIS — I10 Essential (primary) hypertension: Secondary | ICD-10-CM | POA: Insufficient documentation

## 2018-09-30 DIAGNOSIS — W2210XA Striking against or struck by unspecified automobile airbag, initial encounter: Secondary | ICD-10-CM | POA: Diagnosis not present

## 2018-09-30 DIAGNOSIS — S20219A Contusion of unspecified front wall of thorax, initial encounter: Secondary | ICD-10-CM | POA: Diagnosis not present

## 2018-09-30 DIAGNOSIS — N83201 Unspecified ovarian cyst, right side: Secondary | ICD-10-CM | POA: Insufficient documentation

## 2018-09-30 DIAGNOSIS — Y929 Unspecified place or not applicable: Secondary | ICD-10-CM | POA: Diagnosis not present

## 2018-09-30 DIAGNOSIS — R55 Syncope and collapse: Secondary | ICD-10-CM | POA: Diagnosis not present

## 2018-09-30 DIAGNOSIS — Y998 Other external cause status: Secondary | ICD-10-CM | POA: Insufficient documentation

## 2018-09-30 DIAGNOSIS — M542 Cervicalgia: Secondary | ICD-10-CM | POA: Diagnosis not present

## 2018-09-30 DIAGNOSIS — S301XXA Contusion of abdominal wall, initial encounter: Secondary | ICD-10-CM | POA: Insufficient documentation

## 2018-09-30 DIAGNOSIS — S299XXA Unspecified injury of thorax, initial encounter: Secondary | ICD-10-CM | POA: Diagnosis present

## 2018-09-30 LAB — CBC WITH DIFFERENTIAL/PLATELET
Abs Immature Granulocytes: 0.02 10*3/uL (ref 0.00–0.07)
Basophils Absolute: 0 10*3/uL (ref 0.0–0.1)
Basophils Relative: 0 %
Eosinophils Absolute: 0.1 10*3/uL (ref 0.0–0.5)
Eosinophils Relative: 1 %
HCT: 40.8 % (ref 36.0–46.0)
Hemoglobin: 12.8 g/dL (ref 12.0–15.0)
Immature Granulocytes: 0 %
Lymphocytes Relative: 26 %
Lymphs Abs: 2 10*3/uL (ref 0.7–4.0)
MCH: 24.5 pg — ABNORMAL LOW (ref 26.0–34.0)
MCHC: 31.4 g/dL (ref 30.0–36.0)
MCV: 78 fL — ABNORMAL LOW (ref 80.0–100.0)
Monocytes Absolute: 0.8 10*3/uL (ref 0.1–1.0)
Monocytes Relative: 11 %
Neutro Abs: 4.6 10*3/uL (ref 1.7–7.7)
Neutrophils Relative %: 62 %
Platelets: 310 10*3/uL (ref 150–400)
RBC: 5.23 MIL/uL — ABNORMAL HIGH (ref 3.87–5.11)
RDW: 16.1 % — ABNORMAL HIGH (ref 11.5–15.5)
WBC: 7.5 10*3/uL (ref 4.0–10.5)
nRBC: 0 % (ref 0.0–0.2)

## 2018-09-30 LAB — COMPREHENSIVE METABOLIC PANEL
ALT: 29 U/L (ref 0–44)
AST: 29 U/L (ref 15–41)
Albumin: 4.1 g/dL (ref 3.5–5.0)
Alkaline Phosphatase: 59 U/L (ref 38–126)
Anion gap: 10 (ref 5–15)
BUN: 12 mg/dL (ref 6–20)
CO2: 25 mmol/L (ref 22–32)
Calcium: 9.5 mg/dL (ref 8.9–10.3)
Chloride: 102 mmol/L (ref 98–111)
Creatinine, Ser: 0.92 mg/dL (ref 0.44–1.00)
GFR calc Af Amer: >60 mL/min (ref >60)
GFR calc non Af Amer: >60 mL/min (ref >60)
Glucose, Bld: 109 mg/dL — ABNORMAL HIGH (ref 70–99)
Potassium: 3.3 mmol/L — ABNORMAL LOW (ref 3.5–5.1)
Sodium: 137 mmol/L (ref 135–145)
Total Bilirubin: 1.1 mg/dL (ref 0.3–1.2)
Total Protein: 7.7 g/dL (ref 6.5–8.1)

## 2018-09-30 LAB — LIPASE, BLOOD: Lipase: 39 U/L (ref 11–51)

## 2018-09-30 LAB — I-STAT BETA HCG BLOOD, ED (MC, WL, AP ONLY): I-stat hCG, quantitative: <5 m[IU]/mL (ref <5)

## 2018-09-30 MED ORDER — METHOCARBAMOL 500 MG PO TABS: 1000.0000 mg | ORAL_TABLET | Freq: Three times a day (TID) | ORAL | 0 refills | Status: DC | PRN

## 2018-09-30 MED ORDER — IOHEXOL 300 MG/ML  SOLN
100.0000 mL | Freq: Once | INTRAMUSCULAR | Status: AC | PRN
  Administered 2018-09-30: 22:00:00 100 mL via INTRAVENOUS

## 2018-09-30 MED ORDER — METHOCARBAMOL 500 MG PO TABS
1000.0000 mg | ORAL_TABLET | Freq: Once | ORAL | Status: AC
  Administered 2018-09-30: 23:00:00 1000 mg via ORAL
  Filled 2018-09-30: qty 2

## 2018-09-30 MED ORDER — IBUPROFEN 400 MG PO TABS
600.0000 mg | ORAL_TABLET | Freq: Once | ORAL | Status: AC
  Administered 2018-09-30: 600 mg via ORAL
  Filled 2018-09-30: qty 1

## 2018-09-30 MED ORDER — MORPHINE SULFATE (PF) 2 MG/ML IV SOLN: 1.0000 mg | Freq: Once | INTRAVENOUS | Status: DC

## 2018-09-30 MED ORDER — ACETAMINOPHEN 500 MG PO TABS
1000.0000 mg | ORAL_TABLET | Freq: Once | ORAL | Status: AC
  Administered 2018-09-30: 23:00:00 1000 mg via ORAL
  Filled 2018-09-30: qty 2

## 2018-09-30 NOTE — ED Notes (Signed)
XR at bedside

## 2018-09-30 NOTE — ED Notes (Signed)
Pt offered pain medicine, pt does not want at this time.
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
## 2018-09-30 NOTE — Discharge Instructions (Addendum)
Please take Ibuprofen (Advil, motrin) and Tylenol (acetaminophen) to relieve your pain.  You may take up to 600 MG (3 pills) of normal strength ibuprofen every 8 hours as needed.  In between doses of ibuprofen you make take tylenol, up to 1,000 mg (two extra strength pills).  Do not take more than 3,000 mg tylenol in a 24 hour period.  Please check all medication labels as many medications such as pain and cold medications may contain tylenol.  Do not drink alcohol while taking these medications.  Do not take other NSAID'S while taking ibuprofen (such as aleve or naproxen).  Please take ibuprofen with food to decrease stomach upset. ° °Today you received medications that may make you sleepy or impair your ability to make decisions.  For the next 24 hours please do not drive, operate heavy machinery, care for a small child with out another adult present, or perform any activities that may cause harm to you or someone else if you were to fall asleep or be impaired.  ° ° °
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
## 2018-09-30 NOTE — ED Notes (Signed)
Patient transported to CT 

## 2019-07-29 DIAGNOSIS — Z3046 Encounter for surveillance of implantable subdermal contraceptive: Secondary | ICD-10-CM | POA: Diagnosis not present

## 2019-07-29 DIAGNOSIS — Z3049 Encounter for surveillance of other contraceptives: Secondary | ICD-10-CM | POA: Diagnosis not present

## 2019-08-24 DIAGNOSIS — Z01419 Encounter for gynecological examination (general) (routine) without abnormal findings: Secondary | ICD-10-CM | POA: Diagnosis not present

## 2019-08-24 DIAGNOSIS — Z113 Encounter for screening for infections with a predominantly sexual mode of transmission: Secondary | ICD-10-CM | POA: Diagnosis not present

## 2019-08-24 DIAGNOSIS — Z6831 Body mass index (BMI) 31.0-31.9, adult: Secondary | ICD-10-CM | POA: Diagnosis not present

## 2019-08-24 DIAGNOSIS — R030 Elevated blood-pressure reading, without diagnosis of hypertension: Secondary | ICD-10-CM | POA: Diagnosis not present

## 2019-08-24 DIAGNOSIS — Z124 Encounter for screening for malignant neoplasm of cervix: Secondary | ICD-10-CM | POA: Diagnosis not present

## 2019-09-06 MED FILL — OMRON 3 SERIES BP MONITOR D: 30 days supply | Qty: 1 | Fill #0

## 2020-10-23 DIAGNOSIS — Z01419 Encounter for gynecological examination (general) (routine) without abnormal findings: Secondary | ICD-10-CM | POA: Diagnosis not present

## 2020-10-23 DIAGNOSIS — Z0000 Encounter for general adult medical examination without abnormal findings: Secondary | ICD-10-CM | POA: Diagnosis not present

## 2020-10-23 DIAGNOSIS — Z124 Encounter for screening for malignant neoplasm of cervix: Secondary | ICD-10-CM | POA: Diagnosis not present

## 2020-10-23 DIAGNOSIS — Z6831 Body mass index (BMI) 31.0-31.9, adult: Secondary | ICD-10-CM | POA: Diagnosis not present

## 2020-10-23 DIAGNOSIS — R030 Elevated blood-pressure reading, without diagnosis of hypertension: Secondary | ICD-10-CM | POA: Diagnosis not present

## 2020-10-23 LAB — HM PAP SMEAR

## 2020-10-25 ENCOUNTER — Encounter: Payer: Self-pay | Admitting: Nurse Practitioner

## 2020-11-01 ENCOUNTER — Other Ambulatory Visit: Payer: Self-pay

## 2020-11-01 ENCOUNTER — Encounter: Payer: Self-pay | Admitting: Nurse Practitioner

## 2020-11-01 ENCOUNTER — Ambulatory Visit (INDEPENDENT_AMBULATORY_CARE_PROVIDER_SITE_OTHER): Payer: 59 | Admitting: Nurse Practitioner

## 2020-11-01 VITALS — BP 140/90 | HR 79 | Temp 98.2°F | Ht 60.0 in | Wt 162.8 lb

## 2020-11-01 DIAGNOSIS — Z7689 Persons encountering health services in other specified circumstances: Secondary | ICD-10-CM | POA: Diagnosis not present

## 2020-11-01 DIAGNOSIS — I10 Essential (primary) hypertension: Secondary | ICD-10-CM

## 2020-11-01 DIAGNOSIS — E6609 Other obesity due to excess calories: Secondary | ICD-10-CM

## 2020-11-01 DIAGNOSIS — Z6831 Body mass index (BMI) 31.0-31.9, adult: Secondary | ICD-10-CM

## 2020-11-01 MED ORDER — AMLODIPINE BESYLATE 5 MG PO TABS: 5.0000 mg | ORAL_TABLET | Freq: Every day | ORAL | 2 refills | Status: DC

## 2020-11-01 NOTE — Progress Notes (Signed)
I,Yamilka Roman [employer] as a Neurosurgeon for [employer], NP.,have documented all relevant documentation on the behalf of [employer], NP,as directed by  Charlesetta Ivory, NP while in the presence of Charlesetta Ivory, NP.  This visit occurred during the SARS-CoV-2 public health emergency.  Safety protocols were in place, including screening questions prior to the visit, additional usage of staff PPE, and extensive cleaning of exam room while observing appropriate contact time as indicated for disinfecting solutions.  Subjective:     Patient ID: [name] , female    DOB: 1981-01-23 , 40 y.o.   MRN: [id number]   Chief Complaint  Patient presents with   Establish Care   elevated blood pressure    HPI  Patient presents today to establish primary care. She stated she has not seen a pcp since 2018. She used to be a patient here.  Her BP was 142/90.  Diet:  African food. She cuts back on salt. Fish and shrimp and occasional chicken  Exercise: She body pump 2-3 days a week and she runs on treadmill   Wt Readings from Last 3 Encounters: 11/01/20 : 162 lb 12.8 oz (73.8 kg) 05/28/16 : 152 lb 0.1 oz (68.9 kg) 05/22/16 : 159 lb (72.1 kg)      Past Medical History:  Diagnosis Date   Anemia    Headache    Hypertension    Pregnancy induced hypertension      History reviewed. No pertinent family history.   Current Outpatient Medications:    amLODipine (NORVASC) 5 MG tablet, Take 1 tablet (5 mg total) by mouth daily., Disp: 30 tablet, Rfl: 2   Allergies  Allergen Reactions   Penicillins Swelling and Rash    Amoxillin is OK. Has patient had a PCN reaction causing immediate rash, facial/tongue/throat swelling, SOB or lightheadedness with hypotension: Yes Has patient had a PCN reaction causing severe rash involving mucus membranes or skin necrosis: Yes Has patient had a PCN reaction that required hospitalization No Has patient had a PCN reaction occurring within the  last 10 years: No If all of the above answers are "NO", then may proceed with Cephalosporin use.      Review of Systems  Constitutional: Negative.  Negative for chills and fatigue.  HENT:  Negative for congestion.   Eyes:  Negative for visual disturbance.  Respiratory: Negative.  Negative for cough, chest tightness, shortness of breath and wheezing.   Cardiovascular: Negative.  Negative for chest pain and palpitations.  Genitourinary:  Negative for frequency.  Neurological: Negative.  Negative for weakness, numbness and headaches.  Psychiatric/Behavioral: Negative.      Today's Vitals   11/01/20 1437  BP: 140/90  Pulse: 79  Temp: 98.2 F (36.8 C)  Weight: 162 lb 12.8 oz (73.8 kg)  Height: 5' (1.524 m)  PainSc: 0-No pain   Body mass index is 31.79 kg/m.   Objective:  Physical Exam Constitutional:      Appearance: Normal appearance. She is obese.  HENT:     Head: Normocephalic and atraumatic.  Cardiovascular:     Rate and Rhythm: Normal rate and regular rhythm.     Pulses: Normal pulses.     Heart sounds: Normal heart sounds. No murmur heard. Pulmonary:     Effort: Pulmonary effort is normal. No respiratory distress.     Breath sounds: Normal breath sounds. No wheezing.  Skin:    General: Skin is warm and dry.     Capillary Refill: Capillary refill takes less than  2 seconds.  Neurological:     Mental Status: She is alert.  Psychiatric:        Mood and Affect: Mood normal.        Behavior: Behavior normal.        Assessment And Plan:     1. Establishing care with new doctor, encounter for --Patient is here to establish care. Micah Flesher over patient medical, family, social and surgical history. -Reviewed with patient their medications and any allergies  -Reviewed with patient their sexual orientation, drug/tobacco and alcohol use -Dicussed any new concerns with patient  -recommended patient comes in for a physical exam and complete blood work.  -Educated patient  about the importance of annual screenings and immunizations.  -Advised patient to eat a healthy diet along with exercise for atleast 30-45 min atleast 4-5 days of the week.   2. Essential hypertension -BP today was 140/90 -Limit the intake of processed foods and salt intake. You should increase your intake of green vegetables and fruits. Limit the use of alcohol. Limit fast foods and fried foods. Avoid high fatty saturated and trans fat foods. Keep yourself hydrated with drinking water. Avoid red meats. Eat lean meats instead. Exercise for atleast 30-45 min for atleast 4-5 times a week.  -She was on BP meds 2-3 years ago and has not taken any since. Will start her on amlodipine 5 mg daily.  - amLODipine (NORVASC) 5 MG tablet; Take 1 tablet (5 mg total) by mouth daily.  Dispense: 30 tablet; Refill: 2  3. Class 1 obesity due to excess calories with body mass index (BMI) of 31.0 to 31.9 in adult, unspecified whether serious comorbidity present  Advised patient on a healthy diet including avoiding fast food and red meats. Increase the intake of lean meats including grilled chicken and [place].  Drink a lot of water. Decrease intake of fatty foods. Exercise for 30-45 min. 4-5 a week to decrease the risk of cardiac event.   The patient was encouraged to call or send a message through MyChart for any questions or concerns.   Side effects and appropriate use of all the medication(s) were discussed with the patient today. Patient advised to use the medication(s) as directed by their healthcare provider. The patient was encouraged to read, review, and understand all associated package inserts and contact our office with any questions or concerns. The patient accepts the risks of the treatment plan and had an opportunity to ask questions.   Follow up: if symptoms persist or do not get better.   Patient was given opportunity to ask questions. Patient verbalized understanding of the plan and was able to repeat  key elements of the plan. All questions were answered to their satisfaction.  Raman Kumar Falwell, DNP   I, Raman Kailynne Ferrington have reviewed all documentation for this visit. The documentation on 09/27/20 for the exam, diagnosis, procedures, and orders are all accurate and complete.   Patient was given opportunity to ask questions. Patient verbalized understanding of the plan and was able to repeat key elements of the plan. All questions were answered to their satisfaction.  Raman Khadar Monger, DNP   I, Raman Jafar Poffenberger have reviewed all documentation for this visit. The documentation on 11/01/20 for the exam, diagnosis, procedures, and orders are all accurate and complete.   IF YOU HAVE BEEN REFERRED TO A SPECIALIST, IT MAY TAKE 1-2 WEEKS TO SCHEDULE/PROCESS THE REFERRAL. IF YOU HAVE NOT HEARD FROM US/SPECIALIST IN TWO WEEKS, PLEASE GIVE [place] A CALL AT [phone] X  [phone].   THE PATIENT IS ENCOURAGED TO PRACTICE SOCIAL DISTANCING DUE TO THE COVID-19 PANDEMIC.

## 2020-11-14 ENCOUNTER — Ambulatory Visit: Payer: Self-pay | Admitting: Nurse Practitioner

## 2021-02-05 ENCOUNTER — Encounter: Payer: Self-pay | Admitting: Nurse Practitioner

## 2021-02-05 ENCOUNTER — Other Ambulatory Visit: Payer: Self-pay

## 2021-02-05 ENCOUNTER — Ambulatory Visit: Payer: 59 | Admitting: Nurse Practitioner

## 2021-02-05 VITALS — BP 144/90 | Temp 98.9°F | Ht 60.0 in | Wt 158.0 lb

## 2021-02-05 DIAGNOSIS — E6609 Other obesity due to excess calories: Secondary | ICD-10-CM | POA: Diagnosis not present

## 2021-02-05 DIAGNOSIS — I10 Essential (primary) hypertension: Secondary | ICD-10-CM | POA: Diagnosis not present

## 2021-02-05 DIAGNOSIS — Z13228 Encounter for screening for other metabolic disorders: Secondary | ICD-10-CM | POA: Diagnosis not present

## 2021-02-05 DIAGNOSIS — Z0000 Encounter for general adult medical examination without abnormal findings: Secondary | ICD-10-CM

## 2021-02-05 DIAGNOSIS — Z2821 Immunization not carried out because of patient refusal: Secondary | ICD-10-CM | POA: Diagnosis not present

## 2021-02-05 DIAGNOSIS — Z298 Encounter for other specified prophylactic measures: Secondary | ICD-10-CM

## 2021-02-05 DIAGNOSIS — Z6830 Body mass index (BMI) 30.0-30.9, adult: Secondary | ICD-10-CM

## 2021-02-05 DIAGNOSIS — E559 Vitamin D deficiency, unspecified: Secondary | ICD-10-CM

## 2021-02-05 LAB — POCT URINALYSIS DIPSTICK
Bilirubin, UA: NEGATIVE
Glucose, UA: NEGATIVE
Ketones, UA: NEGATIVE
Leukocytes, UA: NEGATIVE
Nitrite, UA: NEGATIVE
Protein, UA: NEGATIVE
Spec Grav, UA: 1.01 (ref 1.010–1.025)
Urobilinogen, UA: 0.2 E.U./dL
pH, UA: 7 (ref 5.0–8.0)

## 2021-02-05 LAB — POCT UA - MICROALBUMIN
Creatinine, POC: 50 mg/dL
Microalbumin Ur, POC: 10 mg/L

## 2021-02-05 MED ORDER — HYDROCHLOROTHIAZIDE 12.5 MG PO CAPS: 12.5000 mg | ORAL_CAPSULE | Freq: Every day | ORAL | 2 refills | Status: DC

## 2021-02-05 MED ORDER — MEFLOQUINE HCL 250 MG PO TABS: ORAL_TABLET | ORAL | 0 refills | Status: DC

## 2021-02-05 NOTE — Progress Notes (Signed)
I,YAMILKA J Llittleton,acting as a Education administrator for [employer], NP.,have documented all relevant documentation on the behalf of [employer], NP,as directed by  Bary Castilla, NP while in the presence of Bary Castilla, NP.  This visit occurred during the SARS-CoV-2 public health emergency.  Safety protocols were in place, including screening questions prior to the visit, additional usage of staff PPE, and extensive cleaning of exam room while observing appropriate contact time as indicated for disinfecting solutions.  Subjective:     Patient ID: [name] , female    DOB: 10-19-1980 , 40 y.o.   MRN: [id number]   Chief Complaint  Patient presents with   Hypertension    HPI  Patient presents today for a blood pressure. Patient was started on amlodipine $RemoveBefor'5mg'cUaTbbFsEXXj$  she stated she checks her blood pressure at home sometimes and it has been a little bit better. She did admit to not taking her med yesterday or today she stated she forgot to take it. She would also like to get her physical exam today. She has seen her OBGYN this summer and had her paps smear.   Hypertension Pertinent negatives include no chest pain, headaches, palpitations or shortness of breath.    Past Medical History:  Diagnosis Date   Anemia    Headache    Hypertension    Pregnancy induced hypertension      No family history on file.   Current Outpatient Medications:    hydrochlorothiazide (MICROZIDE) 12.5 MG capsule, Take 1 capsule (12.5 mg total) by mouth daily., Disp: 30 capsule, Rfl: 2   Allergies  Allergen Reactions   Penicillins Swelling and Rash    Amoxillin is OK. Has patient had a PCN reaction causing immediate rash, facial/tongue/throat swelling, SOB or lightheadedness with hypotension: Yes Has patient had a PCN reaction causing severe rash involving mucus membranes or skin necrosis: Yes Has patient had a PCN reaction that required hospitalization No Has patient had a PCN reaction occurring  within the last 10 years: No If all of the above answers are "NO", then may proceed with Cephalosporin use.      Review of Systems  Constitutional:  Negative for chills and fever.  HENT:  Negative for congestion, postnasal drip, rhinorrhea and sneezing.   Respiratory:  Negative for cough, shortness of breath and wheezing.   Cardiovascular:  Negative for chest pain and palpitations.  Endocrine: Negative for polydipsia, polyphagia and polyuria.  Musculoskeletal:  Negative for arthralgias and myalgias.  Neurological:  Negative for weakness and headaches.    Today's Vitals   02/05/21 1449 02/05/21 1503  BP: (!) 160/100 (!) 144/90  Temp: 98.9 F (37.2 C)   Weight: 158 lb (71.7 kg)   Height: 5' (1.524 m)   PainSc: 0-No pain    Body mass index is 30.86 kg/m.  Wt Readings from Last 3 Encounters:  02/05/21 158 lb (71.7 kg)  11/01/20 162 lb 12.8 oz (73.8 kg)  05/28/16 152 lb 0.1 oz (68.9 kg)     Objective:  Physical Exam Vitals and nursing note reviewed.  Constitutional:      Appearance: Normal appearance.  HENT:     Head: Normocephalic and atraumatic.     Right Ear: Tympanic membrane, ear canal and external ear normal.     Left Ear: Tympanic membrane, ear canal and external ear normal.     Nose: Nose normal.     Mouth/Throat:     Mouth: Mucous membranes are moist.     Pharynx: Oropharynx is clear.  Eyes:     Extraocular Movements: Extraocular movements intact.     Conjunctiva/sclera: Conjunctivae normal.     Pupils: Pupils are equal, round, and reactive to light.  Cardiovascular:     Rate and Rhythm: Normal rate and regular rhythm.     Pulses: Normal pulses.     Heart sounds: Normal heart sounds.  Pulmonary:     Effort: Pulmonary effort is normal.     Breath sounds: Normal breath sounds.  Chest:  Breasts:    Tanner Score is 5.     Right: Normal.     Left: Normal.  Abdominal:     General: Abdomen is flat. Bowel sounds are normal.     Palpations: Abdomen is soft.   Genitourinary:    Comments: deferred Musculoskeletal:        General: Normal range of motion.     Cervical back: Normal range of motion and neck supple.  Skin:    General: Skin is warm and dry.  Neurological:     General: No focal deficit present.     Mental Status: She is alert and oriented to person, place, and time.  Psychiatric:        Mood and Affect: Mood normal.        Behavior: Behavior normal.        Assessment And Plan:     1. Encounter for annual physical exam --Patient is here for their annual physical exam and we discussed any changes to medication and medical history.  -Behavior modification was discussed as well as diet and exercise history  -Patient will continue to exercise regularly and modify their diet.  -Recommendation for yearly physical annuals, immunization and screenings including mammogram and colonoscopy were discussed with the patient.  -Recommended intake of multivitamin, vitamin D and calcium.  -Individualized advise was given to the patient pertaining to their own health history in regards to diet, exercise, medical condition and referrals.  - MM Digital Screening; Future - Hepatitis C antibody  2. Essential hypertension -Limit the intake of processed foods and salt intake. You should increase your intake of green vegetables and fruits. Limit the use of alcohol. Limit fast foods and fried foods. Avoid high fatty saturated and trans fat foods. Keep yourself hydrated with drinking water. Avoid red meats. Eat lean meats instead. Exercise for atleast 30-45 min for atleast 4-5 times a week.  -Was taking amlodipine, will change to hydrochlorothiazide as patient did not like taking amlodipine.  - hydrochlorothiazide (MICROZIDE) 12.5 MG capsule; Take 1 capsule (12.5 mg total) by mouth daily.  Dispense: 30 capsule; Refill: 2 - CMP14+EGFR - CBC no Diff - POCT Urinalysis Dipstick (81002) - POCT UA - Microalbumin - EKG 12-Lead   3. Vitamin D  deficiency -Will check and supplement if needed. Advised patient to spend atleast 15 min. Daily in sunlight.  - Vitamin D (25 hydroxy)  4. Encounter for screening for metabolic disorder - Lipid panel - Hemoglobin A1c  5. Influenza vaccination declined -patient declined.  -Education given on the importance of taking the flu shot.   6. Class 1 obesity due to excess calories without serious comorbidity with body mass index (BMI) of 30.0 to 30.9 in adult  Advised patient on a healthy diet including avoiding fast food and red meats. Increase the intake of lean meats including grilled chicken and [place].  Drink a lot of water. Decrease intake of fatty foods. Exercise for 30-45 min. 4-5 a week to decrease the risk of cardiac event.  7. Need for malaria prophylaxis -Patient traveling to [place].  - mefloquine (LARIAM) 250 MG tablet; Take 1 tablet by mouth 1 week prior to going, once a week while you are there and once a week for 4 weeks when you return.  Dispense: 9 tablet; Refill: 0  The patient was encouraged to call or send a message through [facility] for any questions or concerns.   Follow up: 3 months   Side effects and appropriate use of all the medication(s) were discussed with the patient today. Patient advised to use the medication(s) as directed by their healthcare provider. The patient was encouraged to read, review, and understand all associated package inserts and contact our office with any questions or concerns. The patient accepts the risks of the treatment plan and had an opportunity to ask questions.   Staying healthy and adopting a healthy lifestyle for your overall health is important. You should eat 7 or more servings of fruits and vegetables per day. You should drink plenty of water to keep yourself hydrated and your kidneys healthy. This includes about 65-80+ fluid ounces of water. Limit your intake of animal fats especially for elevated cholesterol. Avoid highly processed food  and limit your salt intake if you have hypertension. Avoid foods high in saturated/Trans fats. Along with a healthy diet it is also very important to maintain time for yourself to maintain a healthy mental health with low stress levels. You should get atleast 150 min of moderate intensity exercise weekly for a healthy heart. Along with eating right and exercising, aim for at least 7-9 hours of sleep daily.  Eat more whole grains which includes barley, wheat berries, oats, brown rice and whole wheat pasta. Use healthy plant oils which include olive, soy, corn, sunflower and peanut. Limit your caffeine and sugary drinks. Limit your intake of fast foods. Limit milk and dairy products to one or two daily servings.   Patient was given opportunity to ask questions. Patient verbalized understanding of the plan and was able to repeat key elements of the plan. All questions were answered to their satisfaction.  Raman Geniece Akers, DNP   I, Raman Marylu Dudenhoeffer have reviewed all documentation for this visit. The documentation on 02/05/21 for the exam, diagnosis, procedures, and orders are all accurate and complete.    IF YOU HAVE BEEN REFERRED TO A SPECIALIST, IT MAY TAKE 1-2 WEEKS TO SCHEDULE/PROCESS THE REFERRAL. IF YOU HAVE NOT HEARD FROM US/SPECIALIST IN TWO WEEKS, PLEASE GIVE [place] A CALL AT [phone].   THE PATIENT IS ENCOURAGED TO PRACTICE SOCIAL DISTANCING DUE TO THE COVID-19 PANDEMIC.

## 2021-02-07 LAB — HEPATITIS C ANTIBODY: Hep C Virus Ab: <0.1 s/co ratio (ref 0.0–0.9)

## 2021-02-07 LAB — CMP14+EGFR
ALT: 9 IU/L (ref 0–32)
AST: 14 IU/L (ref 0–40)
Albumin/Globulin Ratio: 1.3 (ref 1.2–2.2)
Albumin: 4.3 g/dL (ref 3.8–4.8)
Alkaline Phosphatase: 60 IU/L (ref 44–121)
BUN/Creatinine Ratio: 13 (ref 9–23)
BUN: 9 mg/dL (ref 6–24)
Bilirubin Total: 0.3 mg/dL (ref 0.0–1.2)
CO2: 23 mmol/L (ref 20–29)
Calcium: 9.2 mg/dL (ref 8.7–10.2)
Chloride: 101 mmol/L (ref 96–106)
Creatinine, Ser: 0.7 mg/dL (ref 0.57–1.00)
Globulin, Total: 3.2 g/dL (ref 1.5–4.5)
Glucose: 86 mg/dL (ref 65–99)
Potassium: 4.4 mmol/L (ref 3.5–5.2)
Sodium: 138 mmol/L (ref 134–144)
Total Protein: 7.5 g/dL (ref 6.0–8.5)
eGFR: 112 mL/min/{1.73_m2} (ref >59)

## 2021-02-07 LAB — HEMOGLOBIN A1C
Est. average glucose Bld gHb Est-mCnc: 128 mg/dL
Hgb A1c MFr Bld: 6.1 % — ABNORMAL HIGH (ref 4.8–5.6)

## 2021-02-07 LAB — VITAMIN D 25 HYDROXY (VIT D DEFICIENCY, FRACTURES): Vit D, 25-Hydroxy: 20 ng/mL — ABNORMAL LOW (ref 30.0–100.0)

## 2021-02-07 LAB — CBC

## 2021-02-07 LAB — LIPID PANEL
Chol/HDL Ratio: 5.2 ratio — ABNORMAL HIGH (ref 0.0–4.4)
Cholesterol, Total: 215 mg/dL — ABNORMAL HIGH (ref 100–199)
HDL: 41 mg/dL (ref >39)
LDL Chol Calc (NIH): 154 mg/dL — ABNORMAL HIGH (ref 0–99)
Triglycerides: 110 mg/dL (ref 0–149)
VLDL Cholesterol Cal: 20 mg/dL (ref 5–40)

## 2021-02-12 ENCOUNTER — Other Ambulatory Visit: Payer: Self-pay | Admitting: Nurse Practitioner

## 2021-02-12 DIAGNOSIS — E559 Vitamin D deficiency, unspecified: Secondary | ICD-10-CM

## 2021-02-12 MED ORDER — VITAMIN D (ERGOCALCIFEROL) 1.25 MG (50000 UNIT) PO CAPS: 50000.0000 [IU] | ORAL_CAPSULE | ORAL | 0 refills | Status: AC

## 2021-03-13 ENCOUNTER — Other Ambulatory Visit: Payer: Self-pay | Admitting: Nurse Practitioner

## 2021-03-13 DIAGNOSIS — Z1231 Encounter for screening mammogram for malignant neoplasm of breast: Secondary | ICD-10-CM

## 2021-03-22 ENCOUNTER — Ambulatory Visit: Payer: BLUE CROSS/BLUE SHIELD

## 2021-04-26 ENCOUNTER — Ambulatory Visit
Admission: RE | Admit: 2021-04-26 | Discharge: 2021-04-26 | Disposition: A | Discharge status: Home / Self Care | Payer: Self-pay | Source: Ambulatory Visit | Attending: Nurse Practitioner | Admitting: Nurse Practitioner

## 2021-04-26 DIAGNOSIS — Z1231 Encounter for screening mammogram for malignant neoplasm of breast: Secondary | ICD-10-CM

## 2021-04-30 ENCOUNTER — Other Ambulatory Visit: Payer: Self-pay | Admitting: Nurse Practitioner

## 2021-04-30 DIAGNOSIS — R928 Other abnormal and inconclusive findings on diagnostic imaging of breast: Secondary | ICD-10-CM

## 2021-05-06 ENCOUNTER — Telehealth: Payer: Self-pay | Admitting: Nurse Practitioner

## 2021-05-06 NOTE — Telephone Encounter (Signed)
Called Pt no answer left VM rescheduled pt appt. Raman out of office.

## 2021-05-07 ENCOUNTER — Ambulatory Visit: Payer: 59 | Admitting: Nurse Practitioner

## 2021-05-14 ENCOUNTER — Ambulatory Visit: Payer: 59 | Admitting: Nurse Practitioner

## 2021-05-14 ENCOUNTER — Encounter: Payer: Self-pay | Admitting: Nurse Practitioner

## 2021-05-14 ENCOUNTER — Other Ambulatory Visit: Payer: Self-pay

## 2021-05-14 VITALS — BP 120/86 | HR 77 | Temp 97.8°F | Ht 60.0 in | Wt 160.0 lb

## 2021-05-14 DIAGNOSIS — Z6831 Body mass index (BMI) 31.0-31.9, adult: Secondary | ICD-10-CM | POA: Diagnosis not present

## 2021-05-14 DIAGNOSIS — I10 Essential (primary) hypertension: Secondary | ICD-10-CM

## 2021-05-14 DIAGNOSIS — R7303 Prediabetes: Secondary | ICD-10-CM | POA: Diagnosis not present

## 2021-05-14 DIAGNOSIS — E6609 Other obesity due to excess calories: Secondary | ICD-10-CM

## 2021-05-14 DIAGNOSIS — Z2821 Immunization not carried out because of patient refusal: Secondary | ICD-10-CM

## 2021-05-14 MED ORDER — HYDROCHLOROTHIAZIDE 12.5 MG PO CAPS: 12.5000 mg | ORAL_CAPSULE | Freq: Every day | ORAL | 2 refills | Status: AC

## 2021-05-14 NOTE — Patient Instructions (Signed)
How to Take Your Blood Pressure ?Blood pressure is a measurement of how strongly your blood is pressing against the walls of your arteries. Arteries are blood vessels that carry blood from your heart throughout your body. Your health care provider takes your blood pressure at each office visit. You can also take your own blood pressure at home with a blood pressure monitor. ?You may need to take your own blood pressure to: ?Confirm a diagnosis of high blood pressure (hypertension). ?Monitor your blood pressure over time. ?Make sure your blood pressure medicine is working. ?Supplies needed: ?Blood pressure monitor. ?Dining room chair to sit in. ?Table or desk. ?Small notebook and pencil or pen. ?How to prepare ?To get the most accurate reading, avoid the following for 30 minutes before you check your blood pressure: ?Drinking caffeine. ?Drinking alcohol. ?Eating. ?Smoking. ?Exercising. ?Five minutes before you check your blood pressure: ?Use the bathroom and urinate so that you have an empty bladder. ?Sit quietly in a dining room chair. Do not sit in a soft couch or an armchair. Do not talk. ?How to take your blood pressure ?To check your blood pressure, follow the instructions in the manual that came with your blood pressure monitor. If you have a digital blood pressure monitor, the instructions may be as follows: ?Sit up straight in a chair. ?Place your feet on the floor. Do not cross your ankles or legs. ?Rest your left arm at the level of your heart on a table or desk or on the arm of a chair. ?Pull up your shirt sleeve. ?Wrap the blood pressure cuff around the upper part of your left arm, 1 inch (2.5 cm) above your elbow. It is best to wrap the cuff around bare skin. ?Fit the cuff snugly around your arm. You should be able to place only one finger between the cuff and your arm. ?Position the cord so that it rests in the bend of your elbow. ?Press the power button. ?Sit quietly while the cuff inflates and  deflates. ?Read the digital reading on the monitor screen and write the numbers down (record them) in a notebook. ?Wait 2-3 minutes, then repeat the steps, starting at step 1. ?What does my blood pressure reading mean? ?A blood pressure reading consists of a higher number over a lower number. Ideally, your blood pressure should be below 120/80. The first ("top") number is called the systolic pressure. It is a measure of the pressure in your arteries as your heart beats. The second ("bottom") number is called the diastolic pressure. It is a measure of the pressure in your arteries as the heart relaxes. ?Blood pressure is classified into five stages. The following are the stages for adults who do not have a short-term serious illness or a chronic condition. Systolic pressure and diastolic pressure are measured in a unit called mm Hg (millimeters of mercury).  ?Normal ?Systolic pressure: below 120. ?Diastolic pressure: below 80. ?Elevated ?Systolic pressure: 120-129. ?Diastolic pressure: below 80. ?Hypertension stage 1 ?Systolic pressure: 130-139. ?Diastolic pressure: 80-89. ?Hypertension stage 2 ?Systolic pressure: 140 or above. ?Diastolic pressure: 90 or above. ?You can have elevated blood pressure or hypertension even if only the systolic or only the diastolic number in your reading is higher than normal. ?Follow these instructions at home: ?Medicines ?Take over-the-counter and prescription medicines only as told by your health care provider. ?Tell your health care provider if you are having any side effects from blood pressure medicine. ?General instructions ?Check your blood pressure as often as   recommended by your health care provider. ?Check your blood pressure at the same time every day. ?Take your monitor to the next appointment with your health care provider to make sure that: ?You are using it correctly. ?It provides accurate readings. ?Understand what your goal blood pressure numbers are. ?Keep all  follow-up visits as told by your health care provider. This is important. ?General tips ?Your health care provider can suggest a reliable monitor that will meet your needs. There are several types of home blood pressure monitors. ?Choose a monitor that has an arm cuff. Do not choose a monitor that measures your blood pressure from your wrist or finger. ?Choose a cuff that wraps snugly around your upper arm. You should be able to fit only one finger between your arm and the cuff. ?You can buy a blood pressure monitor at most drugstores or online. ?Where to find more information ?American Heart Association: www.heart.org ?Contact a health care provider if: ?Your blood pressure is consistently high. ?Your blood pressure is suddenly low. ?Get help right away if: ?Your systolic blood pressure is higher than 180. ?Your diastolic blood pressure is higher than 120. ?Summary ?Blood pressure is a measurement of how strongly your blood is pressing against the walls of your arteries. ?A blood pressure reading consists of a higher number over a lower number. Ideally, your blood pressure should be below 120/80. ?Check your blood pressure at the same time every day. ?Avoid caffeine, alcohol, smoking, and exercise for 30 minutes prior to checking your blood pressure. These agents can affect the accuracy of the blood pressure reading. ?This information is not intended to replace advice given to you by your health care provider. Make sure you discuss any questions you have with your health care provider. ?Document Revised: 03/14/2020 Document Reviewed: 04/29/2019 ?Elsevier Patient Education ? 2022 Elsevier Inc. ? ?

## 2021-05-14 NOTE — Progress Notes (Signed)
I,Katawbba Wiggins,acting as a Neurosurgeon for [employer], NP.,have documented all relevant documentation on the behalf of [employer], NP,as directed by  Charlesetta Ivory, NP while in the presence of Charlesetta Ivory, NP.  This visit occurred during the SARS-CoV-2 public health emergency.  Safety protocols were in place, including screening questions prior to the visit, additional usage of staff PPE, and extensive cleaning of exam room while observing appropriate contact time as indicated for disinfecting solutions.  Subjective:     Patient ID: [name] , female    DOB: 14-Jul-1980 , 40 y.o.   MRN: [id number]   Chief Complaint  Patient presents with   Hypertension    HPI  The patient is today blood pressure follow-up.  She does admit to forgetting to take her medication. She will try to remember to take it now.  Her BP today was 120/86. She is watching what she eats by avoiding salt intake and processed foods. She drinks water and will try to exercise more. She denies anymore symptoms. She denies blood work today due to lack of time. Will get her labs at her next 3 months visit. Prior labs reviewed with patient. Denies headache, chest pain or shortness of breath.   Hypertension The current episode started more than 1 month ago. Pertinent negatives include no chest pain, headaches, palpitations or shortness of breath.    Past Medical History:  Diagnosis Date   Anemia    Headache    Hypertension    Pregnancy induced hypertension      No family history on file.   Current Outpatient Medications:    Vitamin D, Ergocalciferol, (DRISDOL) 1.25 MG (50000 UNIT) CAPS capsule, Take 1 capsule (50,000 Units total) by mouth every 7 (seven) days., Disp: 12 capsule, Rfl: 0   hydrochlorothiazide (MICROZIDE) 12.5 MG capsule, Take 1 capsule (12.5 mg total) by mouth daily., Disp: 30 capsule, Rfl: 2   Allergies  Allergen Reactions   Penicillins Swelling and Rash    Amoxillin is  OK. Has patient had a PCN reaction causing immediate rash, facial/tongue/throat swelling, SOB or lightheadedness with hypotension: Yes Has patient had a PCN reaction causing severe rash involving mucus membranes or skin necrosis: Yes Has patient had a PCN reaction that required hospitalization No Has patient had a PCN reaction occurring within the last 10 years: No If all of the above answers are "NO", then may proceed with Cephalosporin use.      Review of Systems  Constitutional: Negative.  Negative for chills and fever.  Respiratory: Negative.  Negative for cough and shortness of breath.   Cardiovascular: Negative.  Negative for chest pain and palpitations.  Gastrointestinal: Negative.   Neurological:  Negative for dizziness and headaches.  Psychiatric/Behavioral: Negative.    All other systems reviewed and are negative.   Today's Vitals   05/14/21 1558 05/14/21 1624  BP: (!) 134/96 120/86  Pulse: 77   Temp: 97.8 F (36.6 C)   Weight: 160 lb (72.6 kg)   Height: 5' (1.524 m)    Body mass index is 31.25 kg/m.  Wt Readings from Last 3 Encounters:  05/14/21 160 lb (72.6 kg)  02/05/21 158 lb (71.7 kg)  11/01/20 162 lb 12.8 oz (73.8 kg)    BP Readings from Last 3 Encounters:  05/14/21 120/86  02/05/21 (!) 144/90  11/01/20 140/90    Objective:  Physical Exam Constitutional:      Appearance: She is obese.  HENT:     Head: Normocephalic and atraumatic.  Cardiovascular:     Rate and Rhythm: Normal rate and regular rhythm.     Pulses: Normal pulses.     Heart sounds: Normal heart sounds. No murmur heard. Pulmonary:     Effort: Pulmonary effort is normal. No respiratory distress.     Breath sounds: Normal breath sounds. No wheezing.  Skin:    General: Skin is warm and dry.     Capillary Refill: Capillary refill takes less than 2 seconds.  Neurological:     Mental Status: She is alert and oriented to person, place, and time.        Assessment And Plan:     1.  Essential hypertension -Limit the intake of processed foods and salt intake. You should increase your intake of green vegetables and fruits. Limit the use of alcohol. Limit fast foods and fried foods. Avoid high fatty saturated and trans fat foods. Keep yourself hydrated with drinking water. Avoid red meats. Eat lean meats instead. Exercise for atleast 30-45 min for atleast 4-5 times a week.  -continue meds; controlled  -will check labs at next visit. BP today 120/86  - hydrochlorothiazide (MICROZIDE) 12.5 MG capsule; Take 1 capsule (12.5 mg total) by mouth daily.  Dispense: 30 capsule; Refill: 2  2. Prediabetes --Discussed with patient the importance of glycemic control and long term complications from uncontrolled diabetes. Discussed with the patient the importance of compliance with home glucose monitoring, diet which includes decrease amount of sugary drinks and foods. Importance of exercise was also discussed with the patient. Importance of eye exams, self foot care and compliance to office visits was also discussed with the patient.  --Will check labs at next visit.   3. COVID-19 vaccination declined -Advised patient about the risks and benefits of the covid vaccine.   4. Class 1 obesity due to excess calories without serious comorbidity with body mass index (BMI) of 31.0 to 31.9 in adult Advised patient on a healthy diet including avoiding fast food and red meats. Increase the intake of lean meats including grilled chicken and [place].  Drink a lot of water. Decrease intake of fatty foods. Exercise for 30-45 min. 4-5 a week to decrease the risk of cardiac event.   The patient was encouraged to call or send a message through MyChart for any questions or concerns.   Follow up: if symptoms persist or do not get better.   Side effects and appropriate use of all the medication(s) were discussed with the patient today. Patient advised to use the medication(s) as directed by their healthcare  provider. The patient was encouraged to read, review, and understand all associated package inserts and contact our office with any questions or concerns. The patient accepts the risks of the treatment plan and had an opportunity to ask questions.   Patient was given opportunity to ask questions. Patient verbalized understanding of the plan and was able to repeat key elements of the plan. All questions were answered to their satisfaction.  Raman Obadiah Dennard, DNP   I, Raman Chari Parmenter have reviewed all documentation for this visit. The documentation on 05/14/21 for the exam, diagnosis, procedures, and orders are all accurate and complete.    IF YOU HAVE BEEN REFERRED TO A SPECIALIST, IT MAY TAKE 1-2 WEEKS TO SCHEDULE/PROCESS THE REFERRAL. IF YOU HAVE NOT HEARD FROM US/SPECIALIST IN TWO WEEKS, PLEASE GIVE [place] A CALL AT [phone].   THE PATIENT IS ENCOURAGED TO PRACTICE SOCIAL DISTANCING DUE TO THE COVID-19 PANDEMIC.

## 2021-07-05 ENCOUNTER — Telehealth: Payer: Self-pay

## 2021-07-05 NOTE — Telephone Encounter (Signed)
Left pt vm to give the breast [facility] of [facility] a call, received fax stating pt needs additional imagining for breast. Last seen by RG 05/14/2021. Has upcoming appointment with JM.

## 2021-08-12 ENCOUNTER — Ambulatory Visit: Payer: 59 | Admitting: Nurse Practitioner

## 2021-09-10 ENCOUNTER — Other Ambulatory Visit: Payer: Self-pay

## 2021-11-28 DIAGNOSIS — Z124 Encounter for screening for malignant neoplasm of cervix: Secondary | ICD-10-CM | POA: Diagnosis not present

## 2021-11-28 DIAGNOSIS — Z01411 Encounter for gynecological examination (general) (routine) with abnormal findings: Secondary | ICD-10-CM | POA: Diagnosis not present

## 2021-11-28 DIAGNOSIS — N6320 Unspecified lump in the left breast, unspecified quadrant: Secondary | ICD-10-CM | POA: Diagnosis not present

## 2021-11-28 DIAGNOSIS — R7989 Other specified abnormal findings of blood chemistry: Secondary | ICD-10-CM | POA: Diagnosis not present

## 2021-11-28 DIAGNOSIS — Z139 Encounter for screening, unspecified: Secondary | ICD-10-CM | POA: Diagnosis not present

## 2021-12-02 DIAGNOSIS — N6320 Unspecified lump in the left breast, unspecified quadrant: Secondary | ICD-10-CM | POA: Diagnosis not present

## 2021-12-02 DIAGNOSIS — R928 Other abnormal and inconclusive findings on diagnostic imaging of breast: Secondary | ICD-10-CM | POA: Diagnosis not present

## 2021-12-02 DIAGNOSIS — N6019 Diffuse cystic mastopathy of unspecified breast: Secondary | ICD-10-CM | POA: Diagnosis not present

## 2022-04-01 ENCOUNTER — Other Ambulatory Visit (HOSPITAL_BASED_OUTPATIENT_CLINIC_OR_DEPARTMENT_OTHER): Payer: Self-pay

## 2022-04-01 MED ORDER — INFLUENZA VAC SPLIT QUAD 0.5 ML IM SUSY
PREFILLED_SYRINGE | INTRAMUSCULAR | 0 refills | Status: AC
  Filled 2022-04-01: qty 0.5, 1d supply, fill #0

## 2022-11-04 ENCOUNTER — Other Ambulatory Visit: Payer: Self-pay | Admitting: Obstetrics and Gynecology

## 2022-11-04 DIAGNOSIS — N6019 Diffuse cystic mastopathy of unspecified breast: Secondary | ICD-10-CM

## 2022-11-13 ENCOUNTER — Other Ambulatory Visit: Payer: Self-pay | Admitting: Obstetrics and Gynecology

## 2022-11-13 DIAGNOSIS — N6019 Diffuse cystic mastopathy of unspecified breast: Secondary | ICD-10-CM

## 2022-11-13 DIAGNOSIS — N6320 Unspecified lump in the left breast, unspecified quadrant: Secondary | ICD-10-CM

## 2022-12-01 DIAGNOSIS — R7303 Prediabetes: Secondary | ICD-10-CM | POA: Diagnosis not present

## 2022-12-01 DIAGNOSIS — Z01411 Encounter for gynecological examination (general) (routine) with abnormal findings: Secondary | ICD-10-CM | POA: Diagnosis not present

## 2022-12-01 DIAGNOSIS — N6310 Unspecified lump in the right breast, unspecified quadrant: Secondary | ICD-10-CM | POA: Diagnosis not present

## 2022-12-01 DIAGNOSIS — R7989 Other specified abnormal findings of blood chemistry: Secondary | ICD-10-CM | POA: Diagnosis not present

## 2022-12-01 DIAGNOSIS — Z3040 Encounter for surveillance of contraceptives, unspecified: Secondary | ICD-10-CM | POA: Diagnosis not present

## 2022-12-01 DIAGNOSIS — E559 Vitamin D deficiency, unspecified: Secondary | ICD-10-CM | POA: Diagnosis not present

## 2022-12-10 ENCOUNTER — Other Ambulatory Visit: Payer: Self-pay

## 2023-01-05 ENCOUNTER — Other Ambulatory Visit: Payer: Self-pay

## 2023-04-01 ENCOUNTER — Ambulatory Visit
Admission: RE | Admit: 2023-04-01 | Discharge: 2023-04-01 | Disposition: A | Discharge status: Home / Self Care | Payer: Self-pay | Source: Ambulatory Visit | Attending: Obstetrics and Gynecology | Admitting: Obstetrics and Gynecology

## 2023-04-01 ENCOUNTER — Ambulatory Visit
Admission: RE | Admit: 2023-04-01 | Discharge: 2023-04-01 | Disposition: A | Discharge status: Home / Self Care | Payer: 59 | Source: Ambulatory Visit | Attending: Obstetrics and Gynecology | Admitting: Obstetrics and Gynecology

## 2023-04-01 DIAGNOSIS — N6312 Unspecified lump in the right breast, upper inner quadrant: Secondary | ICD-10-CM | POA: Diagnosis not present

## 2023-04-01 DIAGNOSIS — N6012 Diffuse cystic mastopathy of left breast: Secondary | ICD-10-CM | POA: Diagnosis not present

## 2023-04-01 DIAGNOSIS — N6320 Unspecified lump in the left breast, unspecified quadrant: Secondary | ICD-10-CM

## 2023-04-01 DIAGNOSIS — N6322 Unspecified lump in the left breast, upper inner quadrant: Secondary | ICD-10-CM | POA: Diagnosis not present

## 2023-04-01 DIAGNOSIS — N6019 Diffuse cystic mastopathy of unspecified breast: Secondary | ICD-10-CM

## 2024-02-09 DIAGNOSIS — R7303 Prediabetes: Secondary | ICD-10-CM | POA: Diagnosis not present

## 2024-02-09 DIAGNOSIS — Z124 Encounter for screening for malignant neoplasm of cervix: Secondary | ICD-10-CM | POA: Diagnosis not present

## 2024-02-09 DIAGNOSIS — E559 Vitamin D deficiency, unspecified: Secondary | ICD-10-CM | POA: Diagnosis not present

## 2024-02-09 DIAGNOSIS — Z01419 Encounter for gynecological examination (general) (routine) without abnormal findings: Secondary | ICD-10-CM | POA: Diagnosis not present

## 2024-04-01 DIAGNOSIS — Z1231 Encounter for screening mammogram for malignant neoplasm of breast: Secondary | ICD-10-CM | POA: Diagnosis not present
